# Patient Record
Sex: Female | Born: 1960
Health system: Southern US, Community
[De-identification: ages and names within clinical notes are randomized; demographics above are authoritative.]

## PROBLEM LIST (undated history)

## (undated) DIAGNOSIS — E785 Hyperlipidemia, unspecified: Secondary | ICD-10-CM

## (undated) DIAGNOSIS — L089 Local infection of the skin and subcutaneous tissue, unspecified: Secondary | ICD-10-CM

## (undated) DIAGNOSIS — T4145XA Adverse effect of unspecified anesthetic, initial encounter: Secondary | ICD-10-CM

## (undated) DIAGNOSIS — M1990 Unspecified osteoarthritis, unspecified site: Secondary | ICD-10-CM

## (undated) DIAGNOSIS — F909 Attention-deficit hyperactivity disorder, unspecified type: Secondary | ICD-10-CM

## (undated) DIAGNOSIS — N059 Unspecified nephritic syndrome with unspecified morphologic changes: Secondary | ICD-10-CM

## (undated) DIAGNOSIS — T8859XA Other complications of anesthesia, initial encounter: Secondary | ICD-10-CM

## (undated) DIAGNOSIS — I10 Essential (primary) hypertension: Secondary | ICD-10-CM

## (undated) DIAGNOSIS — G971 Other reaction to spinal and lumbar puncture: Secondary | ICD-10-CM

## (undated) HISTORY — DX: Attention-deficit hyperactivity disorder, unspecified type: F90.9

## (undated) HISTORY — DX: Unspecified nephritic syndrome with unspecified morphologic changes: N05.9

## (undated) HISTORY — PX: ABDOMINAL HYSTERECTOMY: SHX81

## (undated) HISTORY — DX: Hyperlipidemia, unspecified: E78.5

---

## 1997-09-23 ENCOUNTER — Other Ambulatory Visit: Admission: RE | Admit: 1997-09-23 | Discharge: 1997-09-23 | Payer: Self-pay | Admitting: Obstetrics and Gynecology

## 1998-12-04 ENCOUNTER — Other Ambulatory Visit: Admission: RE | Admit: 1998-12-04 | Discharge: 1998-12-04 | Payer: Self-pay | Admitting: Obstetrics and Gynecology

## 1999-11-13 ENCOUNTER — Encounter: Payer: Self-pay | Admitting: Obstetrics and Gynecology

## 1999-11-13 ENCOUNTER — Ambulatory Visit (HOSPITAL_COMMUNITY): Admission: RE | Admit: 1999-11-13 | Discharge: 1999-11-13 | Payer: Self-pay | Admitting: Obstetrics and Gynecology

## 1999-11-28 ENCOUNTER — Encounter: Payer: Self-pay | Admitting: Obstetrics & Gynecology

## 1999-11-28 ENCOUNTER — Inpatient Hospital Stay (HOSPITAL_COMMUNITY): Admission: AD | Admit: 1999-11-28 | Discharge: 1999-11-28 | Payer: Self-pay | Admitting: Obstetrics & Gynecology

## 1999-12-01 ENCOUNTER — Inpatient Hospital Stay (HOSPITAL_COMMUNITY): Admission: AD | Admit: 1999-12-01 | Discharge: 1999-12-05 | Payer: Self-pay | Admitting: Obstetrics and Gynecology

## 1999-12-08 ENCOUNTER — Inpatient Hospital Stay (HOSPITAL_COMMUNITY): Admission: AD | Admit: 1999-12-08 | Discharge: 1999-12-08 | Payer: Self-pay | Admitting: Obstetrics and Gynecology

## 2000-01-29 ENCOUNTER — Other Ambulatory Visit: Admission: RE | Admit: 2000-01-29 | Discharge: 2000-01-29 | Payer: Self-pay | Admitting: Obstetrics and Gynecology

## 2000-10-24 ENCOUNTER — Ambulatory Visit (HOSPITAL_COMMUNITY): Admission: RE | Admit: 2000-10-24 | Discharge: 2000-10-24 | Payer: Self-pay | Admitting: Internal Medicine

## 2000-10-24 ENCOUNTER — Encounter: Payer: Self-pay | Admitting: Internal Medicine

## 2001-10-26 ENCOUNTER — Ambulatory Visit (HOSPITAL_COMMUNITY): Admission: RE | Admit: 2001-10-26 | Discharge: 2001-10-26 | Payer: Self-pay | Admitting: Internal Medicine

## 2001-10-26 ENCOUNTER — Encounter: Payer: Self-pay | Admitting: Internal Medicine

## 2002-09-08 ENCOUNTER — Other Ambulatory Visit: Admission: RE | Admit: 2002-09-08 | Discharge: 2002-09-08 | Payer: Self-pay | Admitting: Obstetrics and Gynecology

## 2003-09-11 ENCOUNTER — Emergency Department (HOSPITAL_COMMUNITY): Admission: EM | Admit: 2003-09-11 | Discharge: 2003-09-11 | Payer: Self-pay | Admitting: Emergency Medicine

## 2004-03-06 ENCOUNTER — Other Ambulatory Visit: Admission: RE | Admit: 2004-03-06 | Discharge: 2004-03-06 | Payer: Self-pay | Admitting: Obstetrics and Gynecology

## 2005-05-01 ENCOUNTER — Other Ambulatory Visit: Admission: RE | Admit: 2005-05-01 | Discharge: 2005-05-01 | Payer: Self-pay | Admitting: Obstetrics and Gynecology

## 2009-04-19 ENCOUNTER — Encounter: Payer: Self-pay | Admitting: Cardiovascular Disease

## 2009-05-30 ENCOUNTER — Ambulatory Visit: Payer: Self-pay | Admitting: Cardiovascular Disease

## 2009-05-30 DIAGNOSIS — R079 Chest pain, unspecified: Secondary | ICD-10-CM | POA: Insufficient documentation

## 2009-05-30 DIAGNOSIS — R002 Palpitations: Secondary | ICD-10-CM

## 2009-06-01 ENCOUNTER — Telehealth (INDEPENDENT_AMBULATORY_CARE_PROVIDER_SITE_OTHER): Payer: Self-pay | Admitting: *Deleted

## 2009-06-05 ENCOUNTER — Ambulatory Visit: Payer: Self-pay | Admitting: Internal Medicine

## 2009-06-05 ENCOUNTER — Ambulatory Visit: Payer: Self-pay

## 2009-06-05 ENCOUNTER — Encounter (HOSPITAL_COMMUNITY): Admission: RE | Admit: 2009-06-05 | Discharge: 2009-08-02 | Payer: Self-pay | Admitting: Cardiovascular Disease

## 2009-06-05 ENCOUNTER — Ambulatory Visit: Payer: Self-pay | Admitting: Cardiovascular Disease

## 2009-06-06 ENCOUNTER — Telehealth (INDEPENDENT_AMBULATORY_CARE_PROVIDER_SITE_OTHER): Payer: Self-pay | Admitting: *Deleted

## 2009-06-08 ENCOUNTER — Encounter: Payer: Self-pay | Admitting: Cardiovascular Disease

## 2009-06-20 ENCOUNTER — Encounter (INDEPENDENT_AMBULATORY_CARE_PROVIDER_SITE_OTHER): Payer: Self-pay | Admitting: *Deleted

## 2009-06-22 ENCOUNTER — Encounter: Admission: RE | Admit: 2009-06-22 | Discharge: 2009-06-22 | Payer: Self-pay | Admitting: Obstetrics and Gynecology

## 2009-06-28 ENCOUNTER — Encounter (INDEPENDENT_AMBULATORY_CARE_PROVIDER_SITE_OTHER): Payer: Self-pay | Admitting: *Deleted

## 2010-03-25 ENCOUNTER — Encounter: Payer: Self-pay | Admitting: Obstetrics and Gynecology

## 2010-04-05 NOTE — Procedures (Signed)
Summary: summary report  summary report   Imported By: Mirna Mires 06/08/2009 10:37:05  _____________________________________________________________________  External Attachment:    Type:   Image     Comment:   External Document

## 2010-04-05 NOTE — Progress Notes (Signed)
Summary: Nuclear Pre-Procedure  Phone Note Outgoing Call Call back at Home Phone [phone]   Call placed by: Stanton Kidney, EMT-P,  June 01, 2009 1:11 PM Action Taken: Phone Call Completed Summary of Call: Left message with information on Myoview Information Sheet (see scanned document for details).     Nuclear Med Background Indications for Stress Test: Evaluation for Ischemia     Symptoms: Palpitations

## 2010-04-05 NOTE — Progress Notes (Signed)
  Walk in Patient Form Recieved ' pt dropped off LAbs" forwarded to [employer]  June 06, 2009 9:43 AM

## 2010-04-05 NOTE — Letter (Signed)
Summary: Pre Visit No Show Letter  [facility]  [address] [place], [place] [address]   Phone: [phone]  Fax: [phone]        June 28, 2009 MRN: [id number]    Penny Rasmussen [address] [place], [place]  [address]    Dear [name],   We have been unable to reach you by phone concerning the pre-procedure visit that you missed on Wednesday 06/28/2009. For this reason,your procedure scheduled on Wednesday 07/12/2009 has been cancelled. Our scheduling staff will gladly assist you with rescheduling your appointments at a more convenient time. Please call our office at [phone] between the hours of 8:00am and 5:00pm, press option #2 to reach an appointment scheduler. Please consider updating your contact numbers at this time so that we can reach you by phone in the future with schedule changes or results.    Thank you,    Ezra Sites RN [facility]

## 2010-04-05 NOTE — Letter (Signed)
Summary: Previsit letter  [facility]  [address] [place], [place] [address]   Phone: [phone]  Fax: [phone]       06/20/2009 MRN: [id number]  [facility] [address] [place], [place]  [address]  Dear Ms. Nicolosi,  Welcome to the Gastroenterology Division at [facility].    You are scheduled to see a nurse for your pre-procedure visit on 06-28-09 at 8:00a.m. on the 3rd floor at [facility], [address]  We ask that you try to arrive at our office 15 minutes prior to your appointment time to allow for check-in.  Your nurse visit will consist of discussing your medical and surgical history, your immediate family medical history, and your medications.    Please bring a complete list of all your medications or, if you prefer, bring the medication bottles and we will list them.  We will need to be aware of both prescribed and over the counter drugs.  We will need to know exact dosage information as well.  If you are on blood thinners (Coumadin, Plavix, Aggrenox, Ticlid, etc.) please call our office today/prior to your appointment, as we need to consult with your physician about holding your medication.   Please be prepared to read and sign documents such as consent forms, a financial agreement, and acknowledgement forms.  If necessary, and with your consent, a friend or relative is welcome to sit-in on the nurse visit with you.  Please bring your insurance card so that we may make a copy of it.  If your insurance requires a referral to see a specialist, please bring your referral form from your primary care physician.  No co-pay is required for this nurse visit.     If you cannot keep your appointment, please call [phone] to cancel or reschedule prior to your appointment date.  This allows [place] the opportunity to schedule an appointment for another patient in need of care.    Thank you for choosing [facility] for your  medical needs.  We appreciate the opportunity to care for you.  Please visit [place] at our website  to learn more about our practice.                     Sincerely.                                                                                                                   The Gastroenterology Division

## 2010-04-05 NOTE — Assessment & Plan Note (Signed)
Summary: Cardiology Nulcear Study  Nuclear Med Background Indications for Stress Test: Evaluation for Ischemia     Symptoms: Chest Pain, Palpitations, Rapid HR    Nuclear Pre-Procedure Caffeine/Decaff Intake: None NPO After: 9:00 PM Lungs: clear IV 0.9% NS with Angio Cath: 20g     IV Site: (R) AC IV Started by: Irean Hong RN Chest Size (in) 36     Cup Size C     Height (in): 62.5 Weight (lb): 130 BMI: 23.48  Nuclear Med Study 1 or 2 day study:  1 day     Stress Test Type:  Stress Reading MD:  Dietrich Pates, MD     Referring MD:  M.Cooper Resting Radionuclide:  Technetium 15m Tetrofosmin     Resting Radionuclide Dose:  11 mCi  Stress Radionuclide:  Technetium 66m Tetrofosmin     Stress Radionuclide Dose:  33 mCi   Stress Protocol Exercise Time (min):  9:30 min     Max HR:  179 bpm     Predicted Max HR:  170 bpm  Max Systolic BP: 157 mm Hg     Percent Max HR:  105.29 %     METS: 10.9 Rate Pressure Product:  [address]    Stress Test Technologist:  Milana Na EMT-P     Nuclear Technologist:  Domenic Polite CNMT  Rest Procedure  Myocardial perfusion imaging was performed at rest 45 minutes following the intravenous administration of Myoview Technetium 77m Tetrofosmin.  Stress Procedure  The patient exercised for 9:30. The patient stopped due to fatigue and denied any chest pain.  There were no significant ST-T wave changes.  Myoview was injected at peak exercise and myocardial perfusion imaging was performed after a brief delay.  QPS Raw Data Images:  Images were motion corrected.  SOft tissue (diaplhragm) underlies heart. Stress Images:  There is normal uptake in all areas. Rest Images:  Normal homogeneous uptake in all areas of the myocardium. Subtraction (SDS):  No evidence of ischemia. Transient Ischemic Dilatation:  .86  (Normal <1.22)  Lung/Heart Ratio:  .26  (Normal <0.45)  Quantitative Gated Spect Images QGS EDV:  52 ml QGS ESV:  20 ml QGS EF:  62  %   Overall Impression  Exercise Capacity: Good exercise capacity. BP Response: Normal blood pressure response. Clinical Symptoms: No chest pain ECG Impression: No significant ST segment change suggestive of ischemia. Overall Impression: Normal stress nuclear study.  Appended Document: Cardiology Nulcear Study Normal stress nuclear study. Results reviewed with pt by telephone. Will f/u as needed.

## 2010-04-05 NOTE — Assessment & Plan Note (Signed)
Summary: 9:00/np6/multiple symptoms/lwb   Visit Type:  Initial Consult Primary Provider:  Dr Vincente Poli  CC:  chest pain.  History of Present Illness: This is a 50 year-old woman who presents for initial evaluation of chest pain and palpitations. She complains of episodic left sided chest pains, both at rest and with exertion, that feel like a 'wave' across the chest. She denies a pressure-like sensation and describes the pain as nonradiating. No associated dyspnea, nausea, or diaphoresis. Also complains of intermittent palpitations, not clearly related to stress or exertion. These oftentimes occur at rest but have also been present with activities. Denies lightheadedness or syncope. She has no history of cardiac disease.  Current Medications (verified): 1)  Enjuvia 0.625 Mg Tabs (Estrogens Conj Synthetic B) .Marland Kitchen.. 1 By Mouth Daily 2)  Adderall 30 Mg Tabs (Amphetamine-Dextroamphetamine) .Marland Kitchen.. 1 By Mouth Daily 3)  Lisinopril 20 Mg Tabs (Lisinopril) .Marland Kitchen.. 1 By Mouth Daily 4)  Lipitor 20 Mg Tabs (Atorvastatin Calcium) .Marland Kitchen.. 1 Po Daily  Allergies (verified): No Known Drug Allergies  Past History:  Past medical, surgical, family and social histories (including risk factors) reviewed, and no changes noted (except as noted below).  Past Medical History: 1. Glomerulonephritis 2. ADHD 3. Hyperlipidemia  Family History: Reviewed history and no changes required. Positive for Coronary and Peripheral Arterial Disease  Social History: Reviewed history and no changes required. Wife of Brieanne Mignone with Vascular Surgery 4 children (3 grown) No tobacco Rare Etoh Active  Review of Systems       Negative except as per HPI   Vital Signs:  Patient profile:   50 year old female Height:      62 inches Weight:      134 pounds BMI:     24.60 Pulse rate:   93 / minute Resp:     16 per minute BP sitting:   128 / 78  (right arm)  Vitals Entered By: Marrion Coy, CNA (May 30, 2009 9:10  AM)  Physical Exam  General:  Pt is alert and oriented, healthy-appearing Caucasion woman, in no acute distress. HEENT: normal Neck: normal carotid upstrokes without bruits, JVP normal Lungs: CTA CV: RRR without murmur or gallop Abd: soft, NT, positive BS, no bruit, no organomegaly Ext: no clubbing, cyanosis, or edema. peripheral pulses 2+ and equal Skin: warm and dry without rash    EKG  Procedure date:  05/30/2009  Findings:      NSR, HR 93 bpm, within normal limits. No significant ST-T changes  Impression & Recommendations:  Problem # 1:  CHEST PAIN (ICD-786.50) The pt has chest pain with both typical and atypical features. CV risk factors include family history of CAD and HTN. Recommend exercise Myoview stress testing for risk stratification. Further recommendations pending stress test results. She is on an appropriate regimen for risk modification, i.e. ACE-I in setting of glomerulonephritis and statin for treatment of hyperlipidemia.  Her updated medication list for this problem includes:    Lisinopril 20 Mg Tabs (Lisinopril) .Marland Kitchen... 1 by mouth daily  Orders: EKG w/ Interpretation ([address]) Holter Monitor (Holter Monitor) Nuclear Stress Test (Nuc Stress Test)  Problem # 2:  PALPITATIONS (ICD-785.1) Check 24 hour Holter monitor to rule out significant arrhythmia.  Her updated medication list for this problem includes:    Lisinopril 20 Mg Tabs (Lisinopril) .Marland Kitchen... 1 by mouth daily  Orders: EKG w/ Interpretation ([address]) Holter Monitor (Holter Monitor) Nuclear Stress Test (Nuc Stress Test)  Patient Instructions: 1)  Your physician recommends that you schedule  a follow-up appointment as needed 2)  Your physician recommends that you continue on your current medications as directed. Please refer to the Current Medication list given to you today. 3)  Your physician has recommended that you wear a holter monitor.  Holter monitors are medical devices that record the heart's  electrical activity. Doctors most often use these monitors to diagnose arrhythmias. Arrhythmias are problems with the speed or rhythm of the heartbeat. The monitor is a small, portable device. You can wear one while you do your normal daily activities. This is usually used to diagnose what is causing palpitations/syncope (passing out). 4)  Your physician has requested that you have an exercise stress myoview.  For further information please visit [email or web address]  Please follow instruction sheet, as given. 5)  You have been diagnosed with palpitations. Palpitations are described as a gradual or sudden awareness of the beating of your heart. It may last seconds, minutes, hours, or days and may be caused by the heart beating slower, faster, more strongly, or more irregularly than normal. They are very common and usually not dangerous. Please see the handout/brochure given to you today for more information.

## 2010-07-20 NOTE — Procedures (Signed)
[facility]  Patient:    Penny Rasmussen, Penny Rasmussen                    MRN: [id number] Proc. Date: 11/29/99 Attending:  Madelyn Rasmussen CC:         Penny Rasmussen, M.D.   Procedure Report  DATE OF BIRTH:  Jul 18, 1947  PROCEDURE:  Lumbar epidural steroid injection.  DIAGNOSIS:  Multilevel degenerative disk disease of the lumbar spine.  HISTORY:  Penny Rasmussen is a very pleasant 50 year old who is sent to [place] by Penny Rasmussen for a series of lumbar epidural steroid injections.  The patient states that she has a long history of back discomfort which dates back to the mid to early 1980s.  She initially required surgery on her lower back in [place] and did well postoperatively up until the early 1990s, when she had recurrence of her back discomfort, and she was treated with a series of epidural steroid injections by Penny Rasmussen with in improvement in 1995, and eventually had repeat surgery by Penny Rasmussen, and she was doing well following this. Unfortunately, on November 30, 1997, the patient fell at work and re-injured her back.  She was seen at [facility] and sent to [facility] where she was treated by Penny Rasmussen.  She recalls getting discograms but was sedated and does not recall whether they initiated anything for her discomfort.  She does not recall any medications that were used at that time.  [employer] insisted, according to the patient, that she go to see Dr. ______ , a physician with the spine institute in [place].  He apparently proceeded with acupuncture and a variety of OxyContin plus several others, the names of which escape her.  She felt as though she was more of a zombie on the medications and did not sustain any improvement.  She did develop headaches to the OxyContin.  The patient returned to see Penny Rasmussen in July, mainly because he had operated on her knees previously and she had had very good  results.  He re-evaluated her and started her on Celebrex, which she states is not very helpful, and Neurontin, but this apparently caused mental alterations, and she has not continued on this.  She currently describes her pain as a constant discomfort in her lower back which is increased markedly by standing to cook within 5-10 minutes with radiation out into the hips and down to the lower extremities with a sense of giveaway weakness and numbness and tingling.  Over the past six weeks, she has noted that when her pain is at its worst, she develops redness over her shins down to the sole of her foot.  She has numbness and tingling to the bottoms of her feet.  Her pain is made worse by walking, standing in a stationary position, or sitting for long periods of time, or lifting.  It is improved by lying down and hydrocodone.  Over the course of the past three years, the pain intensity has increased, and her limitations have also increased.  She has developed poor quality of sleep and episodes of crying.  She continues to have some knee discomfort from her previous surgery.  She is currently seeking legal advice with regard to her Workmens Comp. case.  CURRENT MEDICATIONS:  Celebrex and hydrocodone, and she states that hydrocodone does take the edge off.  ALLERGIES:  PENICILLIN causes hives, CODEINE causes nausea and vomiting.  FAMILY HISTORY:  The patient is adopted.  SOCIAL HISTORY:  The patient is a pack-per-day smoker.  She does not drink alcohol.  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  PAST SURGICAL HISTORY:  Significant for two back surgeries, 23 knee surgeries with knee replacements, gallbladder surgery, right carpal tunnel release, and hysterectomy.  ACTIVE MEDICAL PROBLEMS:  The patient denies any active medical problems.  REVIEW OF SYSTEMS:  GENERAL:  Negative.  HEAD:  Significant for headaches which emanate from the neck forward and have been present for  about the past six months.  They are helped by Tylenol.  EYES: Negative.  NOSE/MOUTH/THROAT: Negative.  EARS:  Negative.  LUNGS:  Negative.  CARDIOVASCULAR:  Negative. GI:  Significant for some gastroesophageal reflux which she attributes to some medications.  She uses some over-the-counter preparations from [employer].  GU: Negative.  MUSCULOSKELETAL:  Significant for ongoing knee discomfort.  She has a history of phlebitis in the left lower extremity.  NEUROLOGICAL:  See HPI. No history of seizure or stroke.  CUTANEOUS:  Negative.  ENDOCRINE:  Negative. PSYCHIATRIC:  She does feel somewhat depressed.  ALLERGY/IMMUNOLOGIC: Negative except for drug allergies.  PHYSICAL EXAMINATION:  VITAL SIGNS:  Blood pressure 142/96, heart rate 75, respiratory rate 18, O2 saturations 93%.  Temperature is 97 degrees.  Pain level is 9/10.  GENERAL:  This is a very pleasant female in no acute distress.  She uses a cane to ambulate.  HEENT:  Head is normocephalic, atraumatic.  Eyes with extraocular movements intact with conjunctive sclerae clear.  Nose with patent nares.  Oropharynx demonstrates dental plates with mucosa intact.  NECK:  Demonstrated mild restriction of range of motion with carotids 2+ and symmetric without appreciable bruits.  HEART:  Regular rate and rhythm.  LUNGS:  Lungs are clear.  BREASTS/ABDOMINAL/PELVIC/RECTAL:  Examinations are not performed.  BACK:  Back exam reveals well healed surgical scar with negative straight leg raise signs with tenderness over the lower lumbar spinous processes.  EXTREMITIES:  The patient is status post surgical interventions over her knees with some limitations of range of motion.  She has trace pitting edema of the lower extremities bilaterally.  Dorsalis pedis pulses and radial pulses are 2+ and symmetric.  NEUROLOGIC:  The patient is oriented x 4.  Cranial nerves 2-12 are grossly intact.  Deep tendon reflexes are symmetric in the upper and  lower extremities  with downgoing toes.  Motor is 5/5 with symmetric bulk and tone.  Sensory is intact to scratch and vibratory sense.  Coordination is grossly intact.  An MRI interpretation from [facility] revealed multilevel severe degenerative disk disease at 2-3, 3-4, 4-5, and to a lesser extent at L5-S1.  IMPRESSION: 1. Low back pain which apparently was responsive to previous surgeries with    exacerbation on November 30, 1997, with MRI demonstrating multilevel    degenerative disk disease. 2. Tension headaches. 3. Gastroesophageal reflux disease.  DISPOSITION:  I discussed the potential risks, benefits, and limitations of a lumbar epidural steroid injection in detail with the patient as well as the side effects of corticosteroids.  She wishes to proceed.  DESCRIPTION OF PROCEDURE:  After informed consent was obtained, the patient was placed in a sitting position and monitored.  Her back was prepped with Betadine x 3.  A skin wheal was raised at the L4-L5 interspace with 1% lidocaine.  A 20 gauge Tuohy needle was introduced to the lumbar epidural space to loss of resistance to preservative-free normal saline.  There was no CSF nor blood.  Medrol 80 mg in 10 mL of preservative-free normal saline was gently injected.  The needle was flushed with preservative-free normal saline and removed intact.  Postprocedure condition - stable.  DISCHARGE INSTRUCTIONS: 1. Resume previous diet. 2. Limitations on activities per instruction sheet. 3. Continue on current medications. 4. Follow up with me in one to two weeks for a repeat injection. DD:  11/29/99 TD:  11/29/99 Job: 1600 [id number]

## 2010-07-20 NOTE — Op Note (Signed)
[facility]  Patient:    Penny Rasmussen, Penny Rasmussen                    MRN: [id number] Proc. Date: 12/01/99 Adm. Date:  [id number] Attending:  Rhina Brackett                           Operative Report  PREOPERATIVE DIAGNOSIS:       36-week intrauterine pregnancy.  Previous cesarean section x 3.  Pregnancy-induced hypertension.  POSTOPERATIVE DIAGNOSIS:      36-week intrauterine pregnancy.  Previous cesarean section x 3.  Pregnancy-induced hypertension.  OPERATION:                    Repeat low transverse cesarean section.  SURGEON:                      Duke Salvia. Marcelle Overlie, M.D.  ASSISTANT:                    Juluis Mire, M.D.  ANESTHESIA:                   Spinal.  COMPLICATIONS:                None.  DRAINS:                       Foley catheter.  ESTIMATED BLOOD LOSS:         900 cc.  DESCRIPTION OF PROCEDURE:     The patient was taken to the operating room and after an adequate level of spinal anesthetic was obtained with the patient in left tilt position, the abdomen was prepped and draped in the usual fashion for sterile abdominal procedures.  Foley catheter was positioned draining clear urine.  A transverse incision was made excising the old scar.  This was carried down to the fascia which was excised and extended transversely.  The rectus muscles were divided in the midline.  The peritoneum was entered superiorly without incident and extended in a vertical manner.  The bladder blade was positioned.  The vesicouterine serosa was then incised and the bladder was bluntly and sharply dissected off of the lower uterine segment and the bladder blade was repositioned.  A transverse incision was made in the lower segment, extended transversely with blunt dissection.  Clear fluid was noted.  The patient was delivered of a 6 pound 11 ounce female, Apgars 8 and 9. The infant was suctioned, cord clamped, and passed to the pediatric team for further  care.  The placenta was delivered manually intact.  The uterus was exteriorized.  The cavity was wiped clean with laparotomy pack.  Closure obtained in the first layer of 0 chromic in a locking fashion followed by an imbricating layer of 0 chromic.  This was hemostatic.  Both tubes and ovaries were normal.  The left ovary was somewhat attenuated with only 1.5 x 1.5 cm portion of ovary remaining with some omental adhesions that were filmy to that side which were lysed.  No other abnormalities noted.  Prior to closure, sponge, needle, and instrument counts were correct x 2.  The rectus muscles were then reapproximated with 2-0 Dexon interrupted sutures.  The fascia closed from laterally to midline on either side with a 0 Dexon running suture. The subcutaneous fat was hemostatic.  Clips and Steri-Strips were used on  the skin.  Pressure dressing was applied.  She tolerated this well and went to the recovery room in good condition.  Clear urine was noted at the end of the case.  She did receive Pitocin IV and Ancef 1 gram IV after the cord was clamped. DD:  12/01/99 TD:  12/01/99 Job: [id number]

## 2010-07-20 NOTE — Discharge Summary (Signed)
[facility]  Patient:    Penny Rasmussen, Penny Rasmussen                    MRN: [id number] Adm. Date:  [id number] Disc. Date: [id number] Attending:  Madelyn Flavors Dictator:   Danie Chandler, R.N.                           Discharge Summary  ADMISSION DIAGNOSES:          1. 36 week intrauterine pregnancy with previous                                  cesarean section x 3.                               2. Pregnancy-induced hypertension.  DISCHARGE DIAGNOSES:          1. 36 week intrauterine pregnancy with previous                                  cesarean section x 3.                               2. Pregnancy-induced hypertension.  PROCEDURES:                   On December 01, 1999, repeat low transverse cesarean section.  REASON FOR ADMISSION:         Please see the dictated H&P.  HOSPITAL COURSE:              The patient was taken to the operating room and underwent the above-named procedure without complications.  This was productive of a viable female infant with Apgars of 8 at one minute and 9 at five minutes.  Postoperatively, the patient did well.  On postoperative day #1, the patient was on magnesium sulfate.  She denied any PIH signs or symptoms.  Her blood pressure was stable.  Her urine output was greater than 200 cc/hr.  Her hemoglobin was 10.6 and platelets 179,000.  Liver function tests okay.  The patient was weaned off magnesium sulfate on this day.  On postoperative day #2, the patient was without complaint, except for pedal edema.  Her blood pressure was 140/96.  She had negative right upper quadrant tenderness.  Her deep tendon reflexes were 1+ with no clonus and she had 1+ edema.  Her PIH labs were rechecked this day and were stable.  She had a repeat ordered for the following day.  On postoperative day #3, the patient was without complaints.  She denied headache, visual change, or right upper quadrant pain.  Her blood pressures were  142-158/80-102.  Her deep tendon reflexes were 1-2+ with no clonus.  She did have increased liver function tests this day, as well as an increased blood pressure.  The patient was watched this day and had repeat laboratory work ordered for the following day. On postoperative day #4, the patient desired discharge home.  Her blood pressures were in the 140s/80s.  There was an increase in the patients SGOT to 72.  Her deep tendon reflexes were 2+.  Those were rechecked in the afternoon and they were  stable.  The SGOT is now 61.  The patient was discharged home on this day.  CONDITION ON DISCHARGE:       Good.  DIET:                         Regular as tolerated.  ACTIVITY:                     No heavy lifting.  No driving.  No vaginal entry.  FOLLOW-UP:                    She is to follow up in the office in two days for blood pressure check, as well as PIH labs.  She is to call for temperature greater than 100 degrees, persistent nausea and vomiting, heavy vaginal bleeding, and/or redness or drainage from the incision, as well as any PIH signs or symptoms.  DISCHARGE MEDICATIONS:        Prenatal vitamins one p.o. q.d. and Percocet one to two p.o. q.4h. p.r.n. pain. DD:  01/03/00 TD:  01/03/00 Job: [id number]

## 2010-09-03 ENCOUNTER — Encounter: Payer: Self-pay | Admitting: Cardiovascular Disease

## 2011-09-25 ENCOUNTER — Ambulatory Visit: Payer: Self-pay | Admitting: Cardiovascular Disease

## 2012-12-03 ENCOUNTER — Other Ambulatory Visit: Payer: Self-pay | Admitting: Family Medicine

## 2012-12-03 DIAGNOSIS — Z1231 Encounter for screening mammogram for malignant neoplasm of breast: Secondary | ICD-10-CM

## 2012-12-10 ENCOUNTER — Ambulatory Visit
Admission: RE | Admit: 2012-12-10 | Discharge: 2012-12-10 | Disposition: A | Discharge status: Home / Self Care | Payer: 59 | Source: Ambulatory Visit | Attending: Family Medicine | Admitting: Family Medicine

## 2012-12-10 DIAGNOSIS — Z1231 Encounter for screening mammogram for malignant neoplasm of breast: Secondary | ICD-10-CM

## 2013-04-20 ENCOUNTER — Other Ambulatory Visit: Payer: Self-pay | Admitting: Vascular Surgery

## 2014-04-22 ENCOUNTER — Other Ambulatory Visit: Payer: Self-pay | Admitting: Family Medicine

## 2014-04-22 DIAGNOSIS — Z1231 Encounter for screening mammogram for malignant neoplasm of breast: Secondary | ICD-10-CM

## 2014-04-29 ENCOUNTER — Ambulatory Visit: Payer: 59

## 2014-05-04 ENCOUNTER — Ambulatory Visit: Payer: 59

## 2015-01-13 ENCOUNTER — Other Ambulatory Visit: Payer: Self-pay | Admitting: Family Medicine

## 2015-01-13 DIAGNOSIS — R945 Abnormal results of liver function studies: Secondary | ICD-10-CM

## 2015-01-20 ENCOUNTER — Ambulatory Visit
Admission: RE | Admit: 2015-01-20 | Discharge: 2015-01-20 | Disposition: A | Discharge status: Home / Self Care | Payer: 59 | Source: Ambulatory Visit | Attending: Family Medicine | Admitting: Family Medicine

## 2015-01-20 DIAGNOSIS — R945 Abnormal results of liver function studies: Secondary | ICD-10-CM

## 2015-03-07 DIAGNOSIS — J329 Chronic sinusitis, unspecified: Secondary | ICD-10-CM | POA: Diagnosis not present

## 2015-03-07 MED FILL — AZELASTINE 0.15% NASAL SPRY: 0.15 | 30 days supply | Qty: 30 | Fill #0

## 2015-03-07 MED FILL — CEFDINIR 300 MG CAPSULE: 300 | 5 days supply | Qty: 10 | Fill #0

## 2015-04-17 DIAGNOSIS — N19 Unspecified kidney failure: Secondary | ICD-10-CM | POA: Diagnosis not present

## 2015-04-17 DIAGNOSIS — R945 Abnormal results of liver function studies: Secondary | ICD-10-CM | POA: Diagnosis not present

## 2015-04-17 DIAGNOSIS — M1000 Idiopathic gout, unspecified site: Secondary | ICD-10-CM | POA: Diagnosis not present

## 2015-04-17 DIAGNOSIS — E782 Mixed hyperlipidemia: Secondary | ICD-10-CM | POA: Diagnosis not present

## 2015-04-19 DIAGNOSIS — E782 Mixed hyperlipidemia: Secondary | ICD-10-CM | POA: Diagnosis not present

## 2015-04-19 DIAGNOSIS — M1000 Idiopathic gout, unspecified site: Secondary | ICD-10-CM | POA: Diagnosis not present

## 2015-04-19 DIAGNOSIS — I10 Essential (primary) hypertension: Secondary | ICD-10-CM | POA: Diagnosis not present

## 2015-04-19 DIAGNOSIS — R945 Abnormal results of liver function studies: Secondary | ICD-10-CM | POA: Diagnosis not present

## 2015-04-19 MED FILL — ESCITALOPRAM 20 MG TABLET: 20 | 90 days supply | Qty: 45 | Fill #0

## 2015-04-19 MED FILL — AMLODIPINE BESYLATE 5 MG TA: 5 | 90 days supply | Qty: 90 | Fill #0

## 2015-04-19 MED FILL — ATORVASTATIN 20 MG TABLET: 20 | 90 days supply | Qty: 90 | Fill #0

## 2015-06-06 DIAGNOSIS — E782 Mixed hyperlipidemia: Secondary | ICD-10-CM | POA: Diagnosis not present

## 2015-06-06 DIAGNOSIS — M1000 Idiopathic gout, unspecified site: Secondary | ICD-10-CM | POA: Diagnosis not present

## 2015-06-06 DIAGNOSIS — R945 Abnormal results of liver function studies: Secondary | ICD-10-CM | POA: Diagnosis not present

## 2015-06-22 MED FILL — ESCITALOPRAM 20 MG TABLET: 20 | 30 days supply | Qty: 30 | Fill #0

## 2015-07-10 DIAGNOSIS — R319 Hematuria, unspecified: Secondary | ICD-10-CM | POA: Diagnosis not present

## 2015-07-10 DIAGNOSIS — R358 Other polyuria: Secondary | ICD-10-CM | POA: Diagnosis not present

## 2015-07-10 DIAGNOSIS — N390 Urinary tract infection, site not specified: Secondary | ICD-10-CM | POA: Diagnosis not present

## 2015-07-10 DIAGNOSIS — N952 Postmenopausal atrophic vaginitis: Secondary | ICD-10-CM | POA: Diagnosis not present

## 2015-07-10 MED FILL — SULFAMETHOXAZOLE/TMP DS TAB: 800-160 | 3 days supply | Qty: 6 | Fill #0

## 2015-07-10 MED FILL — ESTRACE 0.01% CREAM: 0.1 | 30 days supply | Qty: 43 | Fill #0

## 2015-07-26 MED FILL — ESCITALOPRAM 20 MG TABLET: 20 | 30 days supply | Qty: 30 | Fill #1

## 2015-07-26 MED FILL — ATORVASTATIN 20 MG TABLET: 20 | 90 days supply | Qty: 90 | Fill #1

## 2015-07-27 MED FILL — AMLODIPINE BESYLATE 5 MG TA: 5 | 90 days supply | Qty: 90 | Fill #0

## 2015-08-25 DIAGNOSIS — J349 Unspecified disorder of nose and nasal sinuses: Secondary | ICD-10-CM | POA: Diagnosis not present

## 2015-08-25 MED FILL — AMOX TR-K CLV 875-125 MG TA: 875-125 | 10 days supply | Qty: 20 | Fill #0

## 2015-08-28 MED FILL — ESCITALOPRAM 20 MG TABLET: 20 | 30 days supply | Qty: 30 | Fill #2

## 2015-10-10 MED FILL — ESCITALOPRAM 20 MG TABLET: 20 | 30 days supply | Qty: 30 | Fill #3

## 2015-10-31 MED FILL — ATORVASTATIN 20 MG TABLET: 20 | 90 days supply | Qty: 90 | Fill #2

## 2015-11-01 MED FILL — AMLODIPINE BESYLATE 5 MG TA: 5 | 90 days supply | Qty: 90 | Fill #0

## 2015-11-27 MED FILL — ESCITALOPRAM 20 MG TABLET: 20 | 90 days supply | Qty: 90 | Fill #0

## 2015-11-29 MED FILL — COLCHICINE 0.6 MG TABLET: 0.6 | 10 days supply | Qty: 30 | Fill #0

## 2015-12-05 DIAGNOSIS — Z23 Encounter for immunization: Secondary | ICD-10-CM | POA: Diagnosis not present

## 2016-02-06 DIAGNOSIS — Z0000 Encounter for general adult medical examination without abnormal findings: Secondary | ICD-10-CM | POA: Diagnosis not present

## 2016-02-09 ENCOUNTER — Other Ambulatory Visit: Payer: Self-pay | Admitting: Family Medicine

## 2016-02-09 DIAGNOSIS — Z6830 Body mass index (BMI) 30.0-30.9, adult: Secondary | ICD-10-CM | POA: Diagnosis not present

## 2016-02-09 DIAGNOSIS — Z0000 Encounter for general adult medical examination without abnormal findings: Secondary | ICD-10-CM | POA: Diagnosis not present

## 2016-02-09 DIAGNOSIS — Z1231 Encounter for screening mammogram for malignant neoplasm of breast: Secondary | ICD-10-CM

## 2016-02-09 DIAGNOSIS — Z23 Encounter for immunization: Secondary | ICD-10-CM | POA: Diagnosis not present

## 2016-02-09 DIAGNOSIS — Z1211 Encounter for screening for malignant neoplasm of colon: Secondary | ICD-10-CM | POA: Diagnosis not present

## 2016-02-09 MED FILL — ATORVASTATIN 40 MG TABLET: 40 | 90 days supply | Qty: 90 | Fill #0

## 2016-02-09 MED FILL — COLCHICINE 0.6 MG TABLET: 0.6 | 10 days supply | Qty: 30 | Fill #0

## 2016-02-14 MED FILL — AMLODIPINE BESYLATE 5 MG TA: 5 | 90 days supply | Qty: 90 | Fill #0

## 2016-02-21 DIAGNOSIS — H524 Presbyopia: Secondary | ICD-10-CM | POA: Diagnosis not present

## 2016-02-21 DIAGNOSIS — H5203 Hypermetropia, bilateral: Secondary | ICD-10-CM | POA: Diagnosis not present

## 2016-02-21 DIAGNOSIS — H52223 Regular astigmatism, bilateral: Secondary | ICD-10-CM | POA: Diagnosis not present

## 2016-03-19 ENCOUNTER — Ambulatory Visit
Admission: RE | Admit: 2016-03-19 | Discharge: 2016-03-19 | Disposition: A | Discharge status: Home / Self Care | Payer: 59 | Source: Ambulatory Visit | Attending: Family Medicine | Admitting: Family Medicine

## 2016-03-19 ENCOUNTER — Ambulatory Visit: Payer: 59

## 2016-03-19 DIAGNOSIS — Z1231 Encounter for screening mammogram for malignant neoplasm of breast: Secondary | ICD-10-CM | POA: Diagnosis not present

## 2016-04-01 MED FILL — ESCITALOPRAM 20 MG TABLET: 20 | 90 days supply | Qty: 90 | Fill #0

## 2016-04-08 DIAGNOSIS — L814 Other melanin hyperpigmentation: Secondary | ICD-10-CM | POA: Diagnosis not present

## 2016-04-08 DIAGNOSIS — D225 Melanocytic nevi of trunk: Secondary | ICD-10-CM | POA: Diagnosis not present

## 2016-04-08 DIAGNOSIS — D2261 Melanocytic nevi of right upper limb, including shoulder: Secondary | ICD-10-CM | POA: Diagnosis not present

## 2016-04-08 DIAGNOSIS — L821 Other seborrheic keratosis: Secondary | ICD-10-CM | POA: Diagnosis not present

## 2016-04-08 DIAGNOSIS — L718 Other rosacea: Secondary | ICD-10-CM | POA: Diagnosis not present

## 2016-04-08 DIAGNOSIS — D171 Benign lipomatous neoplasm of skin and subcutaneous tissue of trunk: Secondary | ICD-10-CM | POA: Diagnosis not present

## 2016-04-08 DIAGNOSIS — D2271 Melanocytic nevi of right lower limb, including hip: Secondary | ICD-10-CM | POA: Diagnosis not present

## 2016-04-08 DIAGNOSIS — L570 Actinic keratosis: Secondary | ICD-10-CM | POA: Diagnosis not present

## 2016-04-08 DIAGNOSIS — D2371 Other benign neoplasm of skin of right lower limb, including hip: Secondary | ICD-10-CM | POA: Diagnosis not present

## 2016-04-08 DIAGNOSIS — D2262 Melanocytic nevi of left upper limb, including shoulder: Secondary | ICD-10-CM | POA: Diagnosis not present

## 2016-05-17 MED FILL — ATORVASTATIN 40 MG TABLET: 40 | 90 days supply | Qty: 90 | Fill #1

## 2016-05-17 MED FILL — AMLODIPINE BESYLATE 5 MG TA: 5 | 90 days supply | Qty: 90 | Fill #0

## 2016-06-25 DIAGNOSIS — M1000 Idiopathic gout, unspecified site: Secondary | ICD-10-CM | POA: Diagnosis not present

## 2016-06-25 DIAGNOSIS — E782 Mixed hyperlipidemia: Secondary | ICD-10-CM | POA: Diagnosis not present

## 2016-07-02 DIAGNOSIS — F330 Major depressive disorder, recurrent, mild: Secondary | ICD-10-CM | POA: Diagnosis not present

## 2016-07-02 DIAGNOSIS — M1000 Idiopathic gout, unspecified site: Secondary | ICD-10-CM | POA: Diagnosis not present

## 2016-07-02 DIAGNOSIS — E782 Mixed hyperlipidemia: Secondary | ICD-10-CM | POA: Diagnosis not present

## 2016-07-02 DIAGNOSIS — N19 Unspecified kidney failure: Secondary | ICD-10-CM | POA: Diagnosis not present

## 2016-07-02 MED FILL — ZOLPIDEM TARTRATE 5 MG TAB: 5 | 30 days supply | Qty: 30 | Fill #0

## 2016-07-02 MED FILL — ESCITALOPRAM 20 MG TABLET: 20 | 90 days supply | Qty: 90 | Fill #0

## 2016-08-08 MED FILL — ZOLPIDEM TARTRATE 5 MG TAB: 5 | 30 days supply | Qty: 30 | Fill #1

## 2016-08-14 ENCOUNTER — Ambulatory Visit: Payer: Self-pay | Admitting: Surgery

## 2016-08-14 DIAGNOSIS — M799 Soft tissue disorder, unspecified: Secondary | ICD-10-CM | POA: Diagnosis not present

## 2016-08-14 MED FILL — ATORVASTATIN 40 MG TABLET: 40 | 90 days supply | Qty: 90 | Fill #0

## 2016-08-14 MED FILL — AMLODIPINE BESYLATE 5 MG TA: 5 | 90 days supply | Qty: 90 | Fill #1

## 2016-10-21 ENCOUNTER — Encounter (HOSPITAL_BASED_OUTPATIENT_CLINIC_OR_DEPARTMENT_OTHER)
Admission: RE | Admit: 2016-10-21 | Discharge: 2016-10-21 | Disposition: A | Discharge status: Home / Self Care | Payer: 59 | Source: Ambulatory Visit | Attending: Surgery | Admitting: Surgery

## 2016-10-21 ENCOUNTER — Encounter (HOSPITAL_BASED_OUTPATIENT_CLINIC_OR_DEPARTMENT_OTHER): Payer: Self-pay | Admitting: *Deleted

## 2016-10-21 DIAGNOSIS — Z01810 Encounter for preprocedural cardiovascular examination: Secondary | ICD-10-CM | POA: Diagnosis not present

## 2016-10-21 DIAGNOSIS — M799 Soft tissue disorder, unspecified: Secondary | ICD-10-CM | POA: Diagnosis not present

## 2016-10-24 ENCOUNTER — Ambulatory Visit (HOSPITAL_BASED_OUTPATIENT_CLINIC_OR_DEPARTMENT_OTHER): Payer: 59 | Admitting: Anesthesiology

## 2016-10-24 ENCOUNTER — Encounter (HOSPITAL_BASED_OUTPATIENT_CLINIC_OR_DEPARTMENT_OTHER)
Admission: RE | Disposition: A | Discharge status: Home / Self Care | Payer: Self-pay | Source: Ambulatory Visit | Attending: Surgery

## 2016-10-24 ENCOUNTER — Encounter (HOSPITAL_BASED_OUTPATIENT_CLINIC_OR_DEPARTMENT_OTHER): Payer: Self-pay

## 2016-10-24 ENCOUNTER — Ambulatory Visit (HOSPITAL_BASED_OUTPATIENT_CLINIC_OR_DEPARTMENT_OTHER)
Admission: RE | Admit: 2016-10-24 | Discharge: 2016-10-24 | Disposition: A | Discharge status: Home / Self Care | Payer: 59 | Source: Ambulatory Visit | Attending: Surgery | Admitting: Surgery

## 2016-10-24 DIAGNOSIS — I10 Essential (primary) hypertension: Secondary | ICD-10-CM | POA: Diagnosis not present

## 2016-10-24 DIAGNOSIS — Z8249 Family history of ischemic heart disease and other diseases of the circulatory system: Secondary | ICD-10-CM | POA: Insufficient documentation

## 2016-10-24 DIAGNOSIS — N059 Unspecified nephritic syndrome with unspecified morphologic changes: Secondary | ICD-10-CM | POA: Diagnosis not present

## 2016-10-24 DIAGNOSIS — M1990 Unspecified osteoarthritis, unspecified site: Secondary | ICD-10-CM | POA: Diagnosis not present

## 2016-10-24 DIAGNOSIS — F909 Attention-deficit hyperactivity disorder, unspecified type: Secondary | ICD-10-CM | POA: Diagnosis not present

## 2016-10-24 DIAGNOSIS — E7800 Pure hypercholesterolemia, unspecified: Secondary | ICD-10-CM | POA: Insufficient documentation

## 2016-10-24 DIAGNOSIS — D171 Benign lipomatous neoplasm of skin and subcutaneous tissue of trunk: Secondary | ICD-10-CM | POA: Insufficient documentation

## 2016-10-24 DIAGNOSIS — M7989 Other specified soft tissue disorders: Secondary | ICD-10-CM | POA: Diagnosis present

## 2016-10-24 DIAGNOSIS — L918 Other hypertrophic disorders of the skin: Secondary | ICD-10-CM | POA: Diagnosis not present

## 2016-10-24 DIAGNOSIS — R222 Localized swelling, mass and lump, trunk: Secondary | ICD-10-CM | POA: Diagnosis not present

## 2016-10-24 DIAGNOSIS — Z79899 Other long term (current) drug therapy: Secondary | ICD-10-CM | POA: Insufficient documentation

## 2016-10-24 HISTORY — DX: Other complications of anesthesia, initial encounter: T88.59XA

## 2016-10-24 HISTORY — DX: Unspecified osteoarthritis, unspecified site: M19.90

## 2016-10-24 HISTORY — DX: Other reaction to spinal and lumbar puncture: G97.1

## 2016-10-24 HISTORY — DX: Adverse effect of unspecified anesthetic, initial encounter: T41.45XA

## 2016-10-24 HISTORY — PX: LIPOMA EXCISION: SHX5283

## 2016-10-24 HISTORY — DX: Essential (primary) hypertension: I10

## 2016-10-24 SURGERY — EXCISION LIPOMA
Anesthesia: General | Site: Back | Laterality: Right

## 2016-10-24 MED ORDER — MIDAZOLAM HCL 5 MG/5ML IJ SOLN
INTRAMUSCULAR | Status: DC | PRN
  Administered 2016-10-24: 2 mg via INTRAVENOUS

## 2016-10-24 MED ORDER — ONDANSETRON HCL 4 MG/2ML IJ SOLN
INTRAMUSCULAR | Status: DC | PRN
  Administered 2016-10-24: 4 mg via INTRAVENOUS

## 2016-10-24 MED ORDER — LIDOCAINE HCL (PF) 1 % IJ SOLN
INTRAMUSCULAR | Status: AC
  Filled 2016-10-24: qty 30

## 2016-10-24 MED ORDER — EPHEDRINE SULFATE 50 MG/ML IJ SOLN
INTRAMUSCULAR | Status: DC | PRN
  Administered 2016-10-24: 10 mg via INTRAVENOUS

## 2016-10-24 MED ORDER — SCOPOLAMINE 1 MG/3DAYS TD PT72: 1.0000 | MEDICATED_PATCH | Freq: Once | TRANSDERMAL | Status: DC | PRN

## 2016-10-24 MED ORDER — PHENYLEPHRINE HCL 10 MG/ML IJ SOLN
INTRAMUSCULAR | Status: DC | PRN
  Administered 2016-10-24 (×2): 80 ug via INTRAVENOUS

## 2016-10-24 MED ORDER — BUPIVACAINE HCL (PF) 0.5 % IJ SOLN
INTRAMUSCULAR | Status: AC
  Filled 2016-10-24: qty 30

## 2016-10-24 MED ORDER — PROPOFOL 10 MG/ML IV BOLUS
INTRAVENOUS | Status: AC
  Filled 2016-10-24: qty 20

## 2016-10-24 MED ORDER — PROMETHAZINE HCL 25 MG/ML IJ SOLN: 6.2500 mg | INTRAMUSCULAR | Status: DC | PRN

## 2016-10-24 MED ORDER — LIDOCAINE 2% (20 MG/ML) 5 ML SYRINGE
INTRAMUSCULAR | Status: AC
  Filled 2016-10-24: qty 5

## 2016-10-24 MED ORDER — LIDOCAINE HCL (CARDIAC) 20 MG/ML IV SOLN: INTRAVENOUS | Status: DC | PRN

## 2016-10-24 MED ORDER — CEFAZOLIN SODIUM-DEXTROSE 2-4 GM/100ML-% IV SOLN
INTRAVENOUS | Status: AC
  Filled 2016-10-24: qty 100

## 2016-10-24 MED ORDER — HYDROCODONE-ACETAMINOPHEN 5-325 MG PO TABS: 1.0000 | ORAL_TABLET | ORAL | 0 refills | Status: DC | PRN

## 2016-10-24 MED ORDER — MIDAZOLAM HCL 2 MG/2ML IJ SOLN: 1.0000 mg | INTRAMUSCULAR | Status: DC | PRN

## 2016-10-24 MED ORDER — LACTATED RINGERS IV SOLN
INTRAVENOUS | Status: DC
  Administered 2016-10-24: 08:00:00 via INTRAVENOUS

## 2016-10-24 MED ORDER — BUPIVACAINE-EPINEPHRINE (PF) 0.5% -1:200000 IJ SOLN
INTRAMUSCULAR | Status: AC
  Filled 2016-10-24: qty 30

## 2016-10-24 MED ORDER — CEFAZOLIN SODIUM-DEXTROSE 2-4 GM/100ML-% IV SOLN
2.0000 g | INTRAVENOUS | Status: AC
  Administered 2016-10-24: 2 g via INTRAVENOUS

## 2016-10-24 MED ORDER — PHENYLEPHRINE 40 MCG/ML (10ML) SYRINGE FOR IV PUSH (FOR BLOOD PRESSURE SUPPORT)
PREFILLED_SYRINGE | INTRAVENOUS | Status: AC
  Filled 2016-10-24: qty 10

## 2016-10-24 MED ORDER — LACTATED RINGERS IV SOLN
INTRAVENOUS | Status: DC | PRN
  Administered 2016-10-24: 07:00:00 via INTRAVENOUS

## 2016-10-24 MED ORDER — FENTANYL CITRATE (PF) 100 MCG/2ML IJ SOLN
INTRAMUSCULAR | Status: AC
  Filled 2016-10-24: qty 2

## 2016-10-24 MED ORDER — DEXAMETHASONE SODIUM PHOSPHATE 10 MG/ML IJ SOLN
INTRAMUSCULAR | Status: AC
  Filled 2016-10-24: qty 1

## 2016-10-24 MED ORDER — CHLORHEXIDINE GLUCONATE CLOTH 2 % EX PADS: 6.0000 | MEDICATED_PAD | Freq: Once | CUTANEOUS | Status: DC

## 2016-10-24 MED ORDER — MIDAZOLAM HCL 2 MG/2ML IJ SOLN
INTRAMUSCULAR | Status: AC
  Filled 2016-10-24: qty 2

## 2016-10-24 MED ORDER — BACITRACIN-NEOMYCIN-POLYMYXIN 400-5-5000 EX OINT
TOPICAL_OINTMENT | CUTANEOUS | Status: AC
  Filled 2016-10-24: qty 1

## 2016-10-24 MED ORDER — BUPIVACAINE-EPINEPHRINE 0.5% -1:200000 IJ SOLN
INTRAMUSCULAR | Status: DC | PRN
  Administered 2016-10-24: 10.5 mL

## 2016-10-24 MED ORDER — EPHEDRINE 5 MG/ML INJ
INTRAVENOUS | Status: AC
  Filled 2016-10-24: qty 10

## 2016-10-24 MED ORDER — LIDOCAINE HCL (CARDIAC) 20 MG/ML IV SOLN
INTRAVENOUS | Status: DC | PRN
  Administered 2016-10-24: 30 mg via INTRAVENOUS

## 2016-10-24 MED ORDER — FENTANYL CITRATE (PF) 100 MCG/2ML IJ SOLN: 25.0000 ug | INTRAMUSCULAR | Status: DC | PRN

## 2016-10-24 MED ORDER — ONDANSETRON HCL 4 MG/2ML IJ SOLN
INTRAMUSCULAR | Status: AC
  Filled 2016-10-24: qty 2

## 2016-10-24 MED ORDER — DEXAMETHASONE SODIUM PHOSPHATE 4 MG/ML IJ SOLN
INTRAMUSCULAR | Status: DC | PRN
  Administered 2016-10-24: 10 mg via INTRAVENOUS

## 2016-10-24 MED ORDER — FENTANYL CITRATE (PF) 100 MCG/2ML IJ SOLN: 50.0000 ug | INTRAMUSCULAR | Status: DC | PRN

## 2016-10-24 MED ORDER — FENTANYL CITRATE (PF) 100 MCG/2ML IJ SOLN
INTRAMUSCULAR | Status: DC | PRN
  Administered 2016-10-24: 100 ug via INTRAVENOUS

## 2016-10-24 MED ORDER — PROPOFOL 10 MG/ML IV BOLUS
INTRAVENOUS | Status: DC | PRN
  Administered 2016-10-24: 50 mg via INTRAVENOUS
  Administered 2016-10-24: 150 mg via INTRAVENOUS
  Administered 2016-10-24: 50 mg via INTRAVENOUS

## 2016-10-24 MED ORDER — PROPOFOL 500 MG/50ML IV EMUL
INTRAVENOUS | Status: AC
  Filled 2016-10-24: qty 50

## 2016-10-24 MED FILL — HYDROCODON-APAP 5-325: 5-325 | 3 days supply | Qty: 20 | Fill #0

## 2016-10-24 SURGICAL SUPPLY — 43 items
BENZOIN TINCTURE PRP APPL 2/3 (GAUZE/BANDAGES/DRESSINGS) ×3 IMPLANT
BLADE SURG 15 STRL LF DISP TIS (BLADE) ×1 IMPLANT
BLADE SURG 15 STRL SS (BLADE) ×2
BNDG COHESIVE 4X5 TAN STRL (GAUZE/BANDAGES/DRESSINGS) IMPLANT
BNDG GAUZE ELAST 4 BULKY (GAUZE/BANDAGES/DRESSINGS) IMPLANT
CHLORAPREP W/TINT 26ML (MISCELLANEOUS) ×3 IMPLANT
CLEANER CAUTERY TIP 5X5 PAD (MISCELLANEOUS) IMPLANT
CLOSURE WOUND 1/2 X4 (GAUZE/BANDAGES/DRESSINGS) ×1
COVER BACK TABLE 60X90IN (DRAPES) ×3 IMPLANT
COVER MAYO STAND STRL (DRAPES) ×3 IMPLANT
DECANTER SPIKE VIAL GLASS SM (MISCELLANEOUS) IMPLANT
DRAPE EXTREMITY T 121X128X90 (DRAPE) IMPLANT
DRAPE LAPAROTOMY 100X72 PEDS (DRAPES) ×3 IMPLANT
DRAPE U-SHAPE 76X120 STRL (DRAPES) IMPLANT
DRAPE UTILITY XL STRL (DRAPES) ×3 IMPLANT
DRSG TEGADERM 4X4.75 (GAUZE/BANDAGES/DRESSINGS) IMPLANT
ELECT REM PT RETURN 9FT ADLT (ELECTROSURGICAL) ×3
ELECTRODE REM PT RTRN 9FT ADLT (ELECTROSURGICAL) ×1 IMPLANT
GAUZE SPONGE 4X4 12PLY STRL LF (GAUZE/BANDAGES/DRESSINGS) ×3 IMPLANT
GLOVE BIO SURGEON STRL SZ 6.5 (GLOVE) ×2 IMPLANT
GLOVE BIO SURGEONS STRL SZ 6.5 (GLOVE) ×1
GLOVE BIOGEL PI IND STRL 7.0 (GLOVE) ×2 IMPLANT
GLOVE BIOGEL PI INDICATOR 7.0 (GLOVE) ×4
GLOVE SURG ORTHO 8.0 STRL STRW (GLOVE) ×3 IMPLANT
GOWN STRL REUS W/ TWL LRG LVL3 (GOWN DISPOSABLE) ×1 IMPLANT
GOWN STRL REUS W/ TWL XL LVL3 (GOWN DISPOSABLE) ×1 IMPLANT
GOWN STRL REUS W/TWL LRG LVL3 (GOWN DISPOSABLE) ×2
GOWN STRL REUS W/TWL XL LVL3 (GOWN DISPOSABLE) ×2
NEEDLE HYPO 25X1 1.5 SAFETY (NEEDLE) ×3 IMPLANT
PACK BASIN DAY SURGERY FS (CUSTOM PROCEDURE TRAY) ×3 IMPLANT
PAD CLEANER CAUTERY TIP 5X5 (MISCELLANEOUS)
PENCIL BUTTON HOLSTER BLD 10FT (ELECTRODE) ×3 IMPLANT
SHEET MEDIUM DRAPE 40X70 STRL (DRAPES) IMPLANT
STOCKINETTE 4X48 STRL (DRAPES) IMPLANT
STRIP CLOSURE SKIN 1/2X4 (GAUZE/BANDAGES/DRESSINGS) ×2 IMPLANT
SUT ETHILON 3 0 PS 1 (SUTURE) IMPLANT
SUT ETHILON 4 0 PS 2 18 (SUTURE) IMPLANT
SUT MNCRL AB 4-0 PS2 18 (SUTURE) ×3 IMPLANT
SUT VICRYL 3-0 CR8 SH (SUTURE) ×3 IMPLANT
SUT VICRYL 4-0 PS2 18IN ABS (SUTURE) IMPLANT
SYR CONTROL 10ML LL (SYRINGE) ×3 IMPLANT
TOWEL OR 17X24 6PK STRL BLUE (TOWEL DISPOSABLE) ×6 IMPLANT
TOWEL OR NON WOVEN STRL DISP B (DISPOSABLE) ×3 IMPLANT

## 2016-10-24 NOTE — Interval H&P Note (Signed)
History and Physical Interval Note:  10/24/2016 7:06 AM  Sherron Monday  has presented today for surgery, with the diagnosis of enlarging soft tissue mass  The various methods of treatment have been discussed with the patient and family. After consideration of risks, benefits and other options for treatment, the patient has consented to    Procedure(s): EXCISION SOFT TISSUE MASS RIGHT UPPER BACK (Right) as a surgical intervention .    The patient's history has been reviewed, patient examined, no change in status, stable for surgery.  I have reviewed the patient's chart and labs.  Questions were answered to the patient's satisfaction.    Earnstine Regal, MD, [facility] Office: [place]

## 2016-10-24 NOTE — Op Note (Signed)
NAME:  Penny Rasmussen, Penny Rasmussen NO.:  MEDICAL RECORD NO.:  [id number]  LOCATION:                                 FACILITY:  PHYSICIAN:  Earnstine Regal, MD           DATE OF BIRTH:  DATE OF PROCEDURE:  10/24/2016                              OPERATIVE REPORT   PREOPERATIVE DIAGNOSES:  Soft tissue mass, right upper back (5.5 x 4.0 x 2.0 cm), skin tag in left scapula region.  PREOPERATIVE DIAGNOSES:  Soft tissue mass, right upper back (5.5 x 4.0 x 2.0 cm), skin tag in left scapula region.  PROCEDURES: 1. Excision, soft tissue mass, right upper back, subcutaneous. 2. Excision, skin tag, left scapular region.  SURGEON:  Earnstine Regal, MD, FACS  ANESTHESIA:  General.  ESTIMATED BLOOD LOSS:  Minimal.  PREPARATION:  ChloraPrep.  COMPLICATIONS:  None.  INDICATIONS:  The patient is a 56 year old female with an enlarging soft tissue mass in the right upper back, which has become symptomatic. There was also an enlarging skin tag on the left shoulder.  She now comes to Surgery for excision.  BODY OF REPORT:  Procedure was done in OR #6 at the [facility].  The patient was brought to the operating room and placed in supine position on the operating room table.  Following administration of general anesthesia, the patient was positioned and then prepped and draped in the usual aseptic fashion.  After ascertaining that an adequate level of anesthesia had been achieved, an incision was made over the soft tissue mass with a #15 blade. Dissection was carried into subcutaneous tissues.  There was a multilobulated mass, which on excision measured 5.5 x 4.0 x 2.0 cm in size.  It was gently mobilized from the subcutaneous tissues down to the underlying fascia.  It was excised using the electrocautery for hemostasis.  The mass was submitted in its entirety to Pathology for review.  Good hemostasis was obtained throughout the wound.  Local anesthetic  was infiltrated in the skin and subcutaneous tissues.  Skin was closed with interrupted 3-0 Vicryl sutures followed by a running 4-0 Monocryl subcuticular suture.  Wound was washed and dried, and Steri-Strips were applied.  There was a skin tag on the left scapular region.  It measured approximately 5 mm in diameter.  It was excised in its entirety with the electrocautery, and hemostasis was achieved with electrocautery.  Local anesthetic was infiltrated under the base of the skin tag.  Wounds were washed and dried.  Sterile dressings were applied.  The patient was awakened from anesthesia and brought to the recovery room. The patient tolerated the procedure well.   Earnstine Regal, MD, [facility] Office: [phone]    TMG/MEDQ  D:  10/24/2016  T:  10/24/2016  Job:  [id number]

## 2016-10-24 NOTE — Anesthesia Procedure Notes (Signed)
Procedure Name: LMA Insertion Date/Time: 10/24/2016 7:31 AM Performed by: Toula Moos L Pre-anesthesia Checklist: Patient identified, Emergency Drugs available, Suction available, Patient being monitored and Timeout performed Patient Re-evaluated:Patient Re-evaluated prior to induction Oxygen Delivery Method: Circle system utilized Preoxygenation: Pre-oxygenation with 100% oxygen Induction Type: IV induction Ventilation: Mask ventilation without difficulty LMA: LMA inserted LMA Size: 4.0 Number of attempts: 1 Airway Equipment and Method: Bite block Placement Confirmation: positive ETCO2 Tube secured with: Tape Dental Injury: Teeth and Oropharynx as per pre-operative assessment

## 2016-10-24 NOTE — H&P (Signed)
[facility]  Penny Rasmussen DOB: 1960-03-14 Undefined / Language: Penny Rasmussen / Race: White Female   History of Present Illness  The patient is a 56 year old female who presents with a soft tissue mass.  CC: soft tissue mass on back  Patient is referred by Dr. Rachell Cipro for evaluation of an enlarging soft tissue mass on the right upper back. Patient states that the mass is been present for approximately 3 years. It is gradually increasing in size. It does cause some discomfort when seated in a high backed chair or when lying supine. It causes some itching. Patient has had no prior such lesions removed. She relates no history of trauma. She has no other such lesions present. She desires surgical excision for relief of symptoms and definitive diagnosis.   Past Surgical History  Cesarean Section - Multiple  Hysterectomy (not due to cancer) - Partial   Diagnostic Studies History  Colonoscopy  1-5 years ago Mammogram  within last year Pap Smear  1-5 years ago  Allergies  No Known Drug Allergies 08/14/2016 Allergies Reconciled   Medication History Escitalopram Oxalate (20MG  Tablet, Oral) Active. Atorvastatin Calcium (40MG  Tablet, Oral) Active. AmLODIPine Besylate (5MG  Tablet, Oral) Active. Zolpidem Tartrate (5MG  Tablet, Oral) Active. Medications Reconciled  Social History Alcohol use  Occasional alcohol use. Caffeine use  Carbonated beverages, Coffee. No drug use  Tobacco use  Never smoker.  Family History  Depression  Sister. Diabetes Mellitus  Mother. Heart Disease  Mother. Heart disease in female family member before age 29  Hypertension  Mother. Melanoma  Father.  Pregnancy / Birth History  Age at menarche  69 years. Age of menopause  <43 Gravida  4 Length (months) of breastfeeding  3-6 Maternal age  60-30 Para  4 Regular periods   Other Problems Hypercholesterolemia      Review of Systems  General Not Present- Appetite Loss, Chills, Fatigue, Fever, Night Sweats, Weight Gain and Weight Loss. Skin Present- Dryness. Not Present- Change in Wart/Mole, Hives, Jaundice, New Lesions, Non-Healing Wounds, Rash and Ulcer. HEENT Present- Wears glasses/contact lenses. Not Present- Earache, Hearing Loss, Hoarseness, Nose Bleed, Oral Ulcers, Ringing in the Ears, Seasonal Allergies, Sinus Pain, Sore Throat, Visual Disturbances and Yellow Eyes. Respiratory Not Present- Bloody sputum, Chronic Cough, Difficulty Breathing, Snoring and Wheezing. Breast Not Present- Breast Mass, Breast Pain, Nipple Discharge and Skin Changes. Cardiovascular Not Present- Chest Pain, Difficulty Breathing Lying Down, Leg Cramps, Palpitations, Rapid Heart Rate, Shortness of Breath and Swelling of Extremities. Gastrointestinal Not Present- Abdominal Pain, Bloating, Bloody Stool, Change in Bowel Habits, Chronic diarrhea, Constipation, Difficulty Swallowing, Excessive gas, Gets full quickly at meals, Hemorrhoids, Indigestion, Nausea, Rectal Pain and Vomiting. Female Genitourinary Not Present- Frequency, Nocturia, Painful Urination, Pelvic Pain and Urgency. Musculoskeletal Not Present- Back Pain, Joint Pain, Joint Stiffness, Muscle Pain, Muscle Weakness and Swelling of Extremities. Neurological Not Present- Decreased Memory, Fainting, Headaches, Numbness, Seizures, Tingling, Tremor, Trouble walking and Weakness. Psychiatric Not Present- Anxiety, Bipolar, Change in Sleep Pattern, Depression, Fearful and Frequent crying. Endocrine Not Present- Cold Intolerance, Excessive Hunger, Hair Changes, Heat Intolerance, Hot flashes and New Diabetes. Hematology Not Present- Blood Thinners, Easy Bruising, Excessive bleeding, Gland problems, HIV and Persistent Infections.  Vitals Weight: 160.6 lb Height: 62in Body Surface Area: 1.74 m Body Mass Index: 29.37 kg/m  Temp.: 72F  Pulse: 92 (Regular)  BP:  120/80 (Sitting, Left Arm, Standard)  Physical Exam The physical exam findings are as follows: Note:CONSTITUTIONAL See vital signs recorded above  GENERAL APPEARANCE Development: normal Nutritional status: normal Gross deformities: none  SKIN Rash, lesions, ulcers: none Induration, erythema: none Nodules: none palpable In the right upper back medial to the scapula is a 6 cm x 4 cm x 2 cm soft tissue mass. This is well defined and somewhat mobile. It is nontender. There are no overlying cutaneous changes.  EYES Conjunctiva and lids: normal Pupils: equal and reactive Iris: normal bilaterally  EARS, NOSE, MOUTH, THROAT External ears: no lesion or deformity External nose: no lesion or deformity Hearing: grossly normal Lips: no lesion or deformity Dentition: normal for age Oral mucosa: moist  NECK Symmetric: yes Trachea: midline Thyroid: No palpable nodules in the thyroid bed  CHEST Respiratory effort: normal Retraction or accessory muscle use: no Breath sounds: normal bilaterally Rales, rhonchi, wheeze: none  CARDIOVASCULAR Auscultation: regular rhythm, normal rate Murmurs: none Pulses: carotid and radial pulse 2+ palpable Lower extremity edema: none Lower extremity varicosities: none  MUSCULOSKELETAL Station and gait: normal Digits and nails: no clubbing or cyanosis Muscle strength: grossly normal all extremities Range of motion: grossly normal all extremities Deformity: none  LYMPHATIC Cervical: none palpable Supraclavicular: none palpable  PSYCHIATRIC Oriented to person, place, and time: yes Mood and affect: normal for situation Judgment and insight: appropriate for situation    Assessment & Plan  SOFT TISSUE MASS (M79.9)  Patient presents for evaluation of an enlarging soft tissue mass on the right upper back. This has been present for approximately 3 years and gradually increasing in size. It causes some discomfort. It causes some itching.  Patient presents today to discuss surgical excision.  I recommended excision under sedation and local anesthesia as an outpatient surgical procedure. We have discussed the location of the surgical incision. We have discussed possibility of seroma formation after surgery. We have discussed minor restrictions on her activities after the procedure. She understands and wishes to proceed with surgery as an outpatient sometime later this summer. We will make arrangements at a time convenient for the patient.  The risks and benefits of the procedure have been discussed at length with the patient. The patient understands the proposed procedure, potential alternative treatments, and the course of recovery to be expected. All of the patient's questions have been answered at this time. The patient wishes to proceed with surgery.  Earnstine Regal, MD, [facility] Office: [phone]

## 2016-10-24 NOTE — Anesthesia Postprocedure Evaluation (Signed)
Anesthesia Post Note  Patient: Penny Rasmussen  Procedure(s) Performed: Procedure(s) (LRB): EXCISION SOFT TISSUE MASS RIGHT UPPER BACK (Right)     Patient location during evaluation: PACU Anesthesia Type: General Level of consciousness: awake and alert Pain management: pain level controlled Vital Signs Assessment: post-procedure vital signs reviewed and stable Respiratory status: spontaneous breathing, nonlabored ventilation and respiratory function stable Cardiovascular status: blood pressure returned to baseline and stable Postop Assessment: no signs of nausea or vomiting Anesthetic complications: no    Last Vitals:  Vitals:   10/24/16 0845 10/24/16 0900  BP: (!) 145/87 (!) 137/98  Pulse: 83 84  Resp: 17 19  Temp:    SpO2: 100% 98%    Last Pain:  Vitals:   10/24/16 0945  TempSrc:   PainSc: 0-No pain                 Catalina Gravel

## 2016-10-24 NOTE — Brief Op Note (Signed)
10/24/2016  8:11 AM  PATIENT:  Penny Rasmussen  56 y.o. female  PRE-OPERATIVE DIAGNOSIS:  enlarging soft tissue mass  POST-OPERATIVE DIAGNOSIS:  enlarging soft tissue mass  PROCEDURE:  Procedure(s): EXCISION SOFT TISSUE MASS RIGHT UPPER BACK (Right)  SURGEON:  Surgeon(s) and Role:    Armandina Gemma, MD - Primary  ANESTHESIA:   general  EBL:  No intake/output data recorded.  BLOOD ADMINISTERED:none  DRAINS: none   LOCAL MEDICATIONS USED:  MARCAINE     SPECIMEN:  Excision  DISPOSITION OF SPECIMEN:  PATHOLOGY  COUNTS:  YES  TOURNIQUET:  * No tourniquets in log *  DICTATION: .Other Dictation: Dictation Number [id number]  PLAN OF CARE: Discharge to home after PACU  PATIENT DISPOSITION:  PACU - hemodynamically stable.   Delay start of Pharmacological VTE agent (>24hrs) due to surgical blood loss or risk of bleeding: yes  Earnstine Regal, MD, [facility] Office: [phone]

## 2016-10-24 NOTE — Anesthesia Preprocedure Evaluation (Addendum)
Anesthesia Evaluation  Patient identified by MRN, date of birth, ID band Patient awake    Reviewed: Allergy & Precautions, NPO status , Patient's Chart, lab work & pertinent test results  History of Anesthesia Complications (+) POST - OP SPINAL HEADACHE and history of anesthetic complications  Airway Mallampati: II  TM Distance: >3 FB Neck ROM: Full    Dental  (+) Teeth Intact, Dental Advisory Given   Pulmonary neg pulmonary ROS,    Pulmonary exam normal breath sounds clear to auscultation       Cardiovascular hypertension, Pt. on medications Normal cardiovascular exam Rhythm:Regular Rate:Normal     Neuro/Psych  Headaches,    GI/Hepatic negative GI ROS, Neg liver ROS,   Endo/Other  negative endocrine ROS  Renal/GU Renal disease (Glomerulonephritis)     Musculoskeletal  (+) Arthritis ,   Abdominal   Peds  (+) ADHD Hematology negative hematology ROS (+)   Anesthesia Other Findings Day of surgery medications reviewed with the patient.  Reproductive/Obstetrics                            Anesthesia Physical Anesthesia Plan  ASA: II  Anesthesia Plan: General   Post-op Pain Management:    Induction: Intravenous  PONV Risk Score and Plan: 3 and Ondansetron, Dexamethasone, Midazolam and Treatment may vary due to age or medical condition  Airway Management Planned: LMA  Additional Equipment:   Intra-op Plan:   Post-operative Plan: Extubation in OR  Informed Consent: I have reviewed the patients History and Physical, chart, labs and discussed the procedure including the risks, benefits and alternatives for the proposed anesthesia with the patient or authorized representative who has indicated his/her understanding and acceptance.   Dental advisory given  Plan Discussed with: CRNA and Anesthesiologist  Anesthesia Plan Comments: (D)       Anesthesia Quick Evaluation

## 2016-10-24 NOTE — Transfer of Care (Signed)
Immediate Anesthesia Transfer of Care Note  Patient: Penny Rasmussen  Procedure(s) Performed: Procedure(s): EXCISION SOFT TISSUE MASS RIGHT UPPER BACK (Right)  Patient Location: PACU  Anesthesia Type:General  Level of Consciousness: awake and patient cooperative  Airway & Oxygen Therapy: Patient Spontanous Breathing and Patient connected to face mask oxygen  Post-op Assessment: Report given to RN and Post -op Vital signs reviewed and stable  Post vital signs: Reviewed and stable  Last Vitals:  Vitals:   10/24/16 0643  BP: (!) 159/98  Pulse: 66  Resp: 18  Temp: 36.5 C  SpO2: 98%    Last Pain:  Vitals:   10/24/16 0643  TempSrc: Oral         Complications: No apparent anesthesia complications

## 2016-10-25 ENCOUNTER — Encounter (HOSPITAL_BASED_OUTPATIENT_CLINIC_OR_DEPARTMENT_OTHER): Payer: Self-pay | Admitting: Surgery

## 2016-10-29 MED FILL — ZOLPIDEM TARTRATE 10 MG TAB: 10 | 30 days supply | Qty: 30 | Fill #0

## 2016-11-06 MED FILL — ALPRAZolam 0.25 MG TABS: 0.25 | 8 days supply | Qty: 45 | Fill #0

## 2016-11-21 MED FILL — ATORVASTATIN 40 MG TABLET: 40 | 90 days supply | Qty: 90 | Fill #1

## 2016-11-21 MED FILL — AMLODIPINE BESYLATE 5 MG TA: 5 | 90 days supply | Qty: 90 | Fill #2

## 2016-11-26 MED FILL — ESCITALOPRAM 20 MG TABLET: 20 | 90 days supply | Qty: 90 | Fill #1

## 2016-11-26 MED FILL — ZOLPIDEM TARTRATE 10 MG TAB: 10 | 30 days supply | Qty: 30 | Fill #1

## 2017-02-13 ENCOUNTER — Other Ambulatory Visit: Payer: Self-pay | Admitting: Family Medicine

## 2017-02-13 DIAGNOSIS — Z1231 Encounter for screening mammogram for malignant neoplasm of breast: Secondary | ICD-10-CM

## 2017-03-12 MED FILL — AMLODIPINE BESYLATE 5 MG TA: 5 | 90 days supply | Qty: 90 | Fill #3

## 2017-03-17 MED FILL — ESCITALOPRAM 20 MG TABLET: 20 | 90 days supply | Qty: 90 | Fill #2

## 2017-03-21 ENCOUNTER — Ambulatory Visit
Admission: RE | Admit: 2017-03-21 | Discharge: 2017-03-21 | Disposition: A | Discharge status: Home / Self Care | Payer: 59 | Source: Ambulatory Visit | Attending: Family Medicine | Admitting: Family Medicine

## 2017-03-21 DIAGNOSIS — Z1231 Encounter for screening mammogram for malignant neoplasm of breast: Secondary | ICD-10-CM

## 2017-03-24 MED FILL — ATORVASTATIN 40 MG TABLET: 40 | 90 days supply | Qty: 90 | Fill #2

## 2017-04-01 MED FILL — ZOLPIDEM TARTRATE 10 MG TAB: 10 | 30 days supply | Qty: 30 | Fill #2

## 2017-04-08 MED FILL — FLUOROURACIL 5% CREAM: 5 | 14 days supply | Qty: 40 | Fill #0

## 2017-04-24 MED FILL — ZOLPIDEM TARTRATE 10 MG TAB: 10 | 30 days supply | Qty: 30 | Fill #3

## 2017-06-24 MED FILL — ATORVASTATIN 40 MG TABLET: 40 | 90 days supply | Qty: 90 | Fill #3

## 2017-06-24 MED FILL — ESCITALOPRAM 20 MG TABLET: 20 | 90 days supply | Qty: 90 | Fill #3

## 2017-06-24 MED FILL — AMLODIPINE BESYLATE 5 MG TA: 5 | 90 days supply | Qty: 90 | Fill #0

## 2017-08-01 ENCOUNTER — Telehealth (INDEPENDENT_AMBULATORY_CARE_PROVIDER_SITE_OTHER): Payer: Self-pay | Admitting: Orthopaedic Surgery

## 2017-08-01 NOTE — Telephone Encounter (Signed)
Dr. Nicole Cella wife called wanting to get an appointment with Dr. Ninfa Linden asap, told her he was booked the next week and on vacation the next, offered her an appointment with Artis Delay but she really wants to see Dr. Ninfa Linden. Asked for me to message you and see what you could do for her? CB # [phone] It would be an appointment for her knee she fell the other day

## 2017-08-01 NOTE — Telephone Encounter (Signed)
I worked patient in for 1pm on Monday

## 2017-08-04 ENCOUNTER — Encounter (INDEPENDENT_AMBULATORY_CARE_PROVIDER_SITE_OTHER): Payer: Self-pay | Admitting: Orthopaedic Surgery

## 2017-08-04 ENCOUNTER — Ambulatory Visit (INDEPENDENT_AMBULATORY_CARE_PROVIDER_SITE_OTHER): Payer: No Typology Code available for payment source | Admitting: Orthopaedic Surgery

## 2017-08-04 ENCOUNTER — Ambulatory Visit (INDEPENDENT_AMBULATORY_CARE_PROVIDER_SITE_OTHER): Payer: No Typology Code available for payment source

## 2017-08-04 DIAGNOSIS — M25562 Pain in left knee: Secondary | ICD-10-CM

## 2017-08-04 MED ORDER — LIDOCAINE HCL 1 % IJ SOLN
3.0000 mL | INTRAMUSCULAR | Status: AC | PRN
  Administered 2017-08-04: 3 mL

## 2017-08-04 MED ORDER — METHYLPREDNISOLONE ACETATE 40 MG/ML IJ SUSP
40.0000 mg | INTRAMUSCULAR | Status: AC | PRN
  Administered 2017-08-04: 40 mg via INTRA_ARTICULAR

## 2017-08-04 NOTE — Progress Notes (Signed)
Office Visit Note   Patient: Penny Rasmussen           Date of Birth: 10-17-1960           MRN: [id number] Visit Date: 08/04/2017              Requested by: Fanny Bien, [address] [place], [place] [address] PCP: Fanny Bien, MD   Assessment & Plan: Visit Diagnoses:  1. Left knee pain, unspecified chronicity     Plan: This is likely  a deep bone bruise and a contusion to the knee.  She would likely benefit from at least an intra-articular steroid injection to see if this helps with her symptoms.  If she is having problems with her continuing after 2 weeks she will let us know because I would then obtain an MRI of her knee based on normal plain films.  I explained the risk and benefits of injections and she tolerated this well.  She will follow-up as needed however  Follow-Up Instructions: Return if symptoms worsen or fail to improve.   Orders:  Orders Placed This Encounter  Procedures  . Large Joint Inj  . XR Knee 1-2 Views Left   No orders of the defined types were placed in this encounter.     Procedures: Large Joint Inj: L knee on 08/04/2017 1:19 PM Indications: diagnostic evaluation and pain Details: 22 G 1.5 in needle, superolateral approach  Arthrogram: No  Medications: 3 mL lidocaine 1 %; 40 mg methylPREDNISolone acetate 40 MG/ML Outcome: tolerated well, no immediate complications Procedure, treatment alternatives, risks and benefits explained, specific risks discussed. Consent was given by the patient. Immediately prior to procedure a time out was called to verify the correct patient, procedure, equipment, support staff and site/side marked as required. Patient was prepped and draped in the usual sterile fashion.       Clinical Data: No additional findings.   Subjective: Chief Complaint  Patient presents with  . Left Knee - Pain  The patient comes in today with acute left knee pain after injury that happened about 2 -3 weeks  ago she was running and tripped in a wood call and hit against her left knee.  She showed me the area of her knee just medial to the patella where she was hit hard.  She did feel like she heard a pop in her knee as well.  She is had limping since then and pain in the knee is really started about her quite a bit even behind the knee in terms of the popliteal area where she points to.  She is never had any knee issues before this injury.  HPI  Review of Systems She currently denies any headache, chest pain, shortness of breath, fever, chills, nausea, vomiting.  Objective: Vital Signs: There were no vitals taken for this visit.  Physical Exam She is alert and oriented x3 and in no acute distress Ortho Exam Examination of the left knee does show bruising just medial to the patella.  She has some slight medial joint tenderness but no effusion.  She has full flexion-extension of her left knee.  McMurray's and Lachman's exams are all normal. Specialty Comments:  No specialty comments available.  Imaging: Xr Knee 1-2 Views Left  Result Date: 08/04/2017 2 views of the left knee show no acute findings.  The joint space is well-maintained and the alignment is neutral.    PMFS History: Patient Active Problem List  Diagnosis Date Noted  . Soft tissue mass 10/24/2016  . PALPITATIONS 05/30/2009  . CHEST PAIN 05/30/2009   Past Medical History:  Diagnosis Date  . ADHD (attention deficit hyperactivity disorder)   . Arthritis    osteoarthritis hands  . Complication of anesthesia   . Glomerulonephritis   . HLD (hyperlipidemia)   . Hypertension   . Spinal headache    with c-section    Family History  Problem Relation Age of Onset  . Coronary artery disease Other   . Peripheral vascular disease Other        Peripheral arterial disease    Past Surgical History:  Procedure Laterality Date  . CESAREAN SECTION     x4  . LIPOMA EXCISION Right 10/24/2016   Procedure: EXCISION SOFT TISSUE  MASS RIGHT UPPER BACK;  Surgeon: Armandina Gemma, MD;  Location: [facility];  Service: General;  Laterality: Right;   Social History   Occupational History  . Not on file  Tobacco Use  . Smoking status: Never Smoker  . Smokeless tobacco: Never Used  Substance and Sexual Activity  . Alcohol use: Yes    Comment: social  . Drug use: No  . Sexual activity: Not on file

## 2017-09-02 ENCOUNTER — Ambulatory Visit (INDEPENDENT_AMBULATORY_CARE_PROVIDER_SITE_OTHER): Payer: No Typology Code available for payment source | Admitting: Orthopaedic Surgery

## 2017-09-02 ENCOUNTER — Ambulatory Visit (INDEPENDENT_AMBULATORY_CARE_PROVIDER_SITE_OTHER): Payer: No Typology Code available for payment source

## 2017-09-02 ENCOUNTER — Encounter (INDEPENDENT_AMBULATORY_CARE_PROVIDER_SITE_OTHER): Payer: Self-pay | Admitting: Orthopaedic Surgery

## 2017-09-02 DIAGNOSIS — M25532 Pain in left wrist: Secondary | ICD-10-CM

## 2017-09-02 DIAGNOSIS — S62317A Displaced fracture of base of fifth metacarpal bone. left hand, initial encounter for closed fracture: Secondary | ICD-10-CM

## 2017-09-02 DIAGNOSIS — M79642 Pain in left hand: Secondary | ICD-10-CM | POA: Diagnosis not present

## 2017-09-02 NOTE — Progress Notes (Addendum)
Office Visit Note   Patient: Penny Rasmussen           Date of Birth: 1960-06-22           MRN: [id number] Visit Date: 09/02/2017              Requested by: Fanny Bien, [address] [place], [place] [address] PCP: Fanny Bien, MD   Assessment & Plan: Visit Diagnoses:  1. Pain in left wrist   2. Pain in left hand   3. Closed displaced fracture of base of fifth metacarpal bone of left hand, initial encounter     Plan: Place her in a removable Velcro wrist splint.  She is to avoid high impact activities on the left wrist and heavy lifting.  We will see her back in 3 weeks at that time we will obtain 3 views of the left hand.  Questions encouraged and answered.  Follow-Up Instructions: Return in about 3 weeks (around 09/23/2017) for Radiographs.   Orders:  Orders Placed This Encounter  Procedures  . XR Hand Complete Left   No orders of the defined types were placed in this encounter.     Procedures: No procedures performed   Clinical Data: No additional findings.   Subjective: Chief Complaint  Patient presents with  . Left Wrist - Pain, Injury    HPI Penny Rasmussen comes in today due to left wrist hand pain due to a fall 3 weeks ago while in [place].  She fell on outstretched hand.  No loss of consciousness dizziness.  She states that her hand and wrist remain swollen. She wants the hand and wrist x-ray x-rayed.  She had a bruise to her knee but the knee overall is doing well. Review of Systems Please see HPI otherwise negative  Objective: Vital Signs: There were no vitals taken for this visit.  Physical Exam  Constitutional: She is oriented to person, place, and time. She appears well-developed and well-nourished. No distress.  Pulmonary/Chest: Effort normal.  Neurological: She is alert and oriented to person, place, and time.  Skin: She is not diaphoretic.  Psychiatric: She has a normal mood and affect.    Ortho Exam Left wrist  good  dorsiflexion and slightly decreased extension of the wrist on the left compared to the right.   Full motion of the hand and able to make a fist.  Sensation grossly intact throughout the hand and  motor grossly intact throughout the left hand.  Slight edema left hand compared to the right.  Tenderness over the base the fifth metacarpal left hand.  Remainder of the left wrist and hand nontender.  No skin lesions ulcerations ecchymosis or erythema left hand and wrist.  Specialty Comments:  No specialty comments available.  Imaging: Xr Hand Complete Left  Result Date: 09/02/2017 Left wrist and hand 3 views: Minimally displaced base the fifth metacarpal shaft fracture.  There is no intra-articular involvement.  No other acute fractures throughout the hand.  Left wrist is without fracture or dislocation.    PMFS History: Patient Active Problem List   Diagnosis Date Noted  . Soft tissue mass 10/24/2016  . PALPITATIONS 05/30/2009  . CHEST PAIN 05/30/2009   Past Medical History:  Diagnosis Date  . ADHD (attention deficit hyperactivity disorder)   . Arthritis    osteoarthritis hands  . Complication of anesthesia   . Glomerulonephritis   . HLD (hyperlipidemia)   . Hypertension   . Spinal headache  with c-section    Family History  Problem Relation Age of Onset  . Coronary artery disease Other   . Peripheral vascular disease Other        Peripheral arterial disease    Past Surgical History:  Procedure Laterality Date  . CESAREAN SECTION     x4  . LIPOMA EXCISION Right 10/24/2016   Procedure: EXCISION SOFT TISSUE MASS RIGHT UPPER BACK;  Surgeon: Armandina Gemma, MD;  Location: [facility];  Service: General;  Laterality: Right;   Social History   Occupational History  . Not on file  Tobacco Use  . Smoking status: Never Smoker  . Smokeless tobacco: Never Used  Substance and Sexual Activity  . Alcohol use: Yes    Comment: social  . Drug use: No  . Sexual activity:  Not on file

## 2017-09-09 MED FILL — BENZONATATE 200 MG CAPSULE: 200 | 10 days supply | Qty: 30 | Fill #0

## 2017-09-09 MED FILL — VENTOLIN HFA 90 MCG INHALER: 108 (90 BAS | 17 days supply | Qty: 18 | Fill #0

## 2017-09-09 MED FILL — predniSONE 10 MG TABS: 10 | 5 days supply | Qty: 15 | Fill #0

## 2017-09-23 ENCOUNTER — Ambulatory Visit (INDEPENDENT_AMBULATORY_CARE_PROVIDER_SITE_OTHER): Payer: No Typology Code available for payment source | Admitting: Orthopaedic Surgery

## 2017-10-02 MED FILL — ESCITALOPRAM 20 MG TABLET: 20 | 30 days supply | Qty: 30 | Fill #0

## 2017-10-02 MED FILL — AMLODIPINE BESYLATE 5 MG TA: 5 | 30 days supply | Qty: 30 | Fill #0

## 2017-10-02 MED FILL — ATORVASTATIN 40 MG TABLET: 40 | 30 days supply | Qty: 30 | Fill #0

## 2017-11-07 MED FILL — ATORVASTATIN 40 MG TABLET: 40 | 90 days supply | Qty: 90 | Fill #0

## 2017-11-07 MED FILL — ESCITALOPRAM 20 MG TABLET: 20 | 90 days supply | Qty: 90 | Fill #0

## 2017-11-07 MED FILL — AMLODIPINE BESYLATE 5 MG TA: 5 | 90 days supply | Qty: 90 | Fill #0

## 2017-11-07 MED FILL — ZOLPIDEM TARTRATE 5 MG TAB: 5 | 30 days supply | Qty: 30 | Fill #0

## 2017-11-07 MED FILL — ESTRADIOL 0.1 MG/GM CRM: 0.1 | 84 days supply | Qty: 43 | Fill #0

## 2017-12-05 MED FILL — ZOLPIDEM TARTRATE 5 MG TAB: 5 | 30 days supply | Qty: 30 | Fill #1

## 2018-02-03 MED FILL — AMLODIPINE BESYLATE 5 MG TA: 5 | 90 days supply | Qty: 90 | Fill #1

## 2018-02-03 MED FILL — ESCITALOPRAM 20 MG TABLET: 20 | 90 days supply | Qty: 90 | Fill #1

## 2018-02-03 MED FILL — ATORVASTATIN 40 MG TABLET: 40 | 90 days supply | Qty: 90 | Fill #1

## 2018-02-09 ENCOUNTER — Other Ambulatory Visit: Payer: Self-pay | Admitting: Family Medicine

## 2018-02-09 DIAGNOSIS — Z1231 Encounter for screening mammogram for malignant neoplasm of breast: Secondary | ICD-10-CM

## 2018-02-11 MED FILL — COLCHICINE 0.6 MG TABS: 0.6 | 10 days supply | Qty: 30 | Fill #0

## 2018-03-23 ENCOUNTER — Ambulatory Visit
Admission: RE | Admit: 2018-03-23 | Discharge: 2018-03-23 | Disposition: A | Discharge status: Home / Self Care | Payer: No Typology Code available for payment source | Source: Ambulatory Visit | Attending: Family Medicine | Admitting: Family Medicine

## 2018-03-23 DIAGNOSIS — Z1231 Encounter for screening mammogram for malignant neoplasm of breast: Secondary | ICD-10-CM | POA: Diagnosis not present

## 2018-04-08 DIAGNOSIS — D1801 Hemangioma of skin and subcutaneous tissue: Secondary | ICD-10-CM | POA: Diagnosis not present

## 2018-04-08 DIAGNOSIS — D2272 Melanocytic nevi of left lower limb, including hip: Secondary | ICD-10-CM | POA: Diagnosis not present

## 2018-04-08 DIAGNOSIS — D225 Melanocytic nevi of trunk: Secondary | ICD-10-CM | POA: Diagnosis not present

## 2018-04-08 DIAGNOSIS — L570 Actinic keratosis: Secondary | ICD-10-CM | POA: Diagnosis not present

## 2018-04-08 DIAGNOSIS — L821 Other seborrheic keratosis: Secondary | ICD-10-CM | POA: Diagnosis not present

## 2018-04-08 DIAGNOSIS — L814 Other melanin hyperpigmentation: Secondary | ICD-10-CM | POA: Diagnosis not present

## 2018-04-08 DIAGNOSIS — D2271 Melanocytic nevi of right lower limb, including hip: Secondary | ICD-10-CM | POA: Diagnosis not present

## 2018-04-08 DIAGNOSIS — D485 Neoplasm of uncertain behavior of skin: Secondary | ICD-10-CM | POA: Diagnosis not present

## 2018-04-08 DIAGNOSIS — D692 Other nonthrombocytopenic purpura: Secondary | ICD-10-CM | POA: Diagnosis not present

## 2018-05-12 DIAGNOSIS — E559 Vitamin D deficiency, unspecified: Secondary | ICD-10-CM | POA: Diagnosis not present

## 2018-05-12 DIAGNOSIS — M1000 Idiopathic gout, unspecified site: Secondary | ICD-10-CM | POA: Diagnosis not present

## 2018-05-14 MED FILL — AMLODIPINE BESYLATE 5 MG TA: 5 | 30 days supply | Qty: 30 | Fill #0

## 2018-05-14 MED FILL — ESCITALOPRAM 20 MG TABLET: 20 | 30 days supply | Qty: 30 | Fill #0

## 2018-05-14 MED FILL — ATORVASTATIN 40 MG TABLET: 40 | 30 days supply | Qty: 30 | Fill #0

## 2018-06-19 DIAGNOSIS — I10 Essential (primary) hypertension: Secondary | ICD-10-CM | POA: Diagnosis not present

## 2018-06-19 DIAGNOSIS — E559 Vitamin D deficiency, unspecified: Secondary | ICD-10-CM | POA: Diagnosis not present

## 2018-06-19 DIAGNOSIS — M1000 Idiopathic gout, unspecified site: Secondary | ICD-10-CM | POA: Diagnosis not present

## 2018-06-19 DIAGNOSIS — N19 Unspecified kidney failure: Secondary | ICD-10-CM | POA: Diagnosis not present

## 2018-06-19 DIAGNOSIS — E782 Mixed hyperlipidemia: Secondary | ICD-10-CM | POA: Diagnosis not present

## 2018-06-19 DIAGNOSIS — G4700 Insomnia, unspecified: Secondary | ICD-10-CM | POA: Diagnosis not present

## 2018-06-19 DIAGNOSIS — F330 Major depressive disorder, recurrent, mild: Secondary | ICD-10-CM | POA: Diagnosis not present

## 2018-06-19 MED FILL — ZOLPIDEM TARTRATE 5 MG TAB: 5 | 30 days supply | Qty: 30 | Fill #0 | Status: TO

## 2018-06-19 MED FILL — ESCITALOPRAM 20 MG TABLET: 20 | 90 days supply | Qty: 90 | Fill #0

## 2018-06-19 MED FILL — ATORVASTATIN 40 MG TABLET: 40 | 90 days supply | Qty: 90 | Fill #0

## 2018-06-19 MED FILL — AMLODIPINE BESYLATE 5 MG TA: 5 | 90 days supply | Qty: 90 | Fill #0

## 2018-07-20 DIAGNOSIS — H5203 Hypermetropia, bilateral: Secondary | ICD-10-CM | POA: Diagnosis not present

## 2018-07-28 MED FILL — ZOLPIDEM TARTRATE 5 MG TAB: 5 | 30 days supply | Qty: 30 | Fill #0

## 2018-08-03 MED FILL — TRETINOIN 0.1 % CREA: 0.1 | 30 days supply | Qty: 45 | Fill #0

## 2018-08-03 MED FILL — COLCHICINE 0.6 MG TABS: 0.6 | 30 days supply | Qty: 90 | Fill #0

## 2018-08-25 MED FILL — ZOLPIDEM TARTRATE 5 MG TAB: 5 | 30 days supply | Qty: 30 | Fill #1

## 2018-09-10 MED FILL — ESCITALOPRAM 20 MG TABLET: 20 | 90 days supply | Qty: 90 | Fill #0

## 2018-09-10 MED FILL — ATORVASTATIN 40 MG TABLET: 40 | 90 days supply | Qty: 90 | Fill #0

## 2018-09-10 MED FILL — AMLODIPINE BESYLATE 5 MG TA: 5 | 90 days supply | Qty: 90 | Fill #0

## 2018-10-12 MED FILL — ZOLPIDEM TARTRATE 5 MG TAB: 5 | 30 days supply | Qty: 30 | Fill #2

## 2018-11-02 MED FILL — COLCHICINE 0.6 MG TABS: 0.6 | 30 days supply | Qty: 90 | Fill #1

## 2018-11-03 DIAGNOSIS — R635 Abnormal weight gain: Secondary | ICD-10-CM | POA: Diagnosis not present

## 2018-11-03 DIAGNOSIS — B373 Candidiasis of vulva and vagina: Secondary | ICD-10-CM | POA: Diagnosis not present

## 2018-11-03 DIAGNOSIS — Z719 Counseling, unspecified: Secondary | ICD-10-CM | POA: Diagnosis not present

## 2018-11-03 MED FILL — FLUCONAZOLE 150 MG TABS: 150 | 14 days supply | Qty: 2 | Fill #0

## 2018-11-05 MED FILL — ZOLPIDEM TARTRATE 5 MG TAB: 5 | 30 days supply | Qty: 30 | Fill #3

## 2018-11-06 DIAGNOSIS — Z20828 Contact with and (suspected) exposure to other viral communicable diseases: Secondary | ICD-10-CM | POA: Diagnosis not present

## 2018-12-08 MED FILL — ZOLPIDEM TARTRATE 5 MG TAB: 5 | 30 days supply | Qty: 30 | Fill #4

## 2018-12-08 MED FILL — FLUARIX QUADRIVALENT 0.5 ML: 0.5 | 1 days supply | Qty: 1 | Fill #0

## 2018-12-17 MED FILL — ESCITALOPRAM 20 MG TABLET: 20 | 90 days supply | Qty: 90 | Fill #1

## 2018-12-17 MED FILL — AMLODIPINE BESYLATE 5 MG TA: 5 | 90 days supply | Qty: 90 | Fill #1

## 2018-12-24 MED FILL — ATORVASTATIN 40 MG TABLET: 40 | 90 days supply | Qty: 90 | Fill #1

## 2019-02-17 ENCOUNTER — Other Ambulatory Visit: Payer: Self-pay | Admitting: Family Medicine

## 2019-02-17 DIAGNOSIS — Z1231 Encounter for screening mammogram for malignant neoplasm of breast: Secondary | ICD-10-CM

## 2019-03-29 MED FILL — AMLODIPINE BESYLATE 5 MG TA: 5 | 90 days supply | Qty: 90 | Fill #2

## 2019-03-29 MED FILL — ATORVASTATIN 40 MG TABLET: 40 | 90 days supply | Qty: 90 | Fill #2

## 2019-03-29 MED FILL — ESCITALOPRAM 20 MG TABLET: 20 | 90 days supply | Qty: 90 | Fill #2

## 2019-04-08 ENCOUNTER — Ambulatory Visit
Admission: RE | Admit: 2019-04-08 | Discharge: 2019-04-08 | Disposition: A | Discharge status: Home / Self Care | Payer: No Typology Code available for payment source | Source: Ambulatory Visit | Attending: Family Medicine | Admitting: Family Medicine

## 2019-04-08 ENCOUNTER — Other Ambulatory Visit: Payer: Self-pay

## 2019-04-08 DIAGNOSIS — Z1231 Encounter for screening mammogram for malignant neoplasm of breast: Secondary | ICD-10-CM | POA: Diagnosis not present

## 2019-04-15 DIAGNOSIS — D485 Neoplasm of uncertain behavior of skin: Secondary | ICD-10-CM | POA: Diagnosis not present

## 2019-04-15 DIAGNOSIS — D225 Melanocytic nevi of trunk: Secondary | ICD-10-CM | POA: Diagnosis not present

## 2019-04-15 DIAGNOSIS — L821 Other seborrheic keratosis: Secondary | ICD-10-CM | POA: Diagnosis not present

## 2019-04-15 DIAGNOSIS — L708 Other acne: Secondary | ICD-10-CM | POA: Diagnosis not present

## 2019-04-15 DIAGNOSIS — L814 Other melanin hyperpigmentation: Secondary | ICD-10-CM | POA: Diagnosis not present

## 2019-04-15 DIAGNOSIS — D2262 Melanocytic nevi of left upper limb, including shoulder: Secondary | ICD-10-CM | POA: Diagnosis not present

## 2019-04-15 DIAGNOSIS — L570 Actinic keratosis: Secondary | ICD-10-CM | POA: Diagnosis not present

## 2019-04-15 MED FILL — TRETINOIN 0.05 % CREA: 0.05 | 30 days supply | Qty: 20 | Fill #0

## 2019-05-15 ENCOUNTER — Ambulatory Visit: Payer: No Typology Code available for payment source | Attending: Internal Medicine

## 2019-05-15 DIAGNOSIS — Z23 Encounter for immunization: Secondary | ICD-10-CM

## 2019-05-15 NOTE — Progress Notes (Signed)
   Covid-19 Vaccination Clinic  Name:  Penny Rasmussen    MRN: [id number] DOB: November 13, 1960  05/15/2019  [name] was observed post Covid-19 immunization for 15 minutes without incident. She was provided with Vaccine Information Sheet and instruction to access the V-Safe system.   [name] was instructed to call 911 with any severe reactions post vaccine: Marland Kitchen Difficulty breathing  . Swelling of face and throat  . A fast heartbeat  . A bad rash all over body  . Dizziness and weakness   Immunizations Administered    Name Date Dose VIS Date Route   Pfizer COVID-19 Vaccine 05/15/2019  4:12 PM 0.3 mL 02/12/2019 Intramuscular   Manufacturer: [place]   Lot: [id number]   [place]: [id number]

## 2019-06-08 ENCOUNTER — Ambulatory Visit: Payer: No Typology Code available for payment source

## 2019-06-08 ENCOUNTER — Ambulatory Visit: Payer: Self-pay | Attending: Internal Medicine

## 2019-06-08 DIAGNOSIS — Z23 Encounter for immunization: Secondary | ICD-10-CM

## 2019-06-08 NOTE — Progress Notes (Signed)
   Covid-19 Vaccination Clinic  Name:  Penny Rasmussen    MRN: [id number] DOB: 31-Jan-1961  06/08/2019  [name] was observed post Covid-19 immunization for 15 minutes without incident. She was provided with Vaccine Information Sheet and instruction to access the V-Safe system.   [name] was instructed to call 911 with any severe reactions post vaccine: Marland Kitchen Difficulty breathing  . Swelling of face and throat  . A fast heartbeat  . A bad rash all over body  . Dizziness and weakness   Immunizations Administered    Name Date Dose VIS Date Route   Pfizer COVID-19 Vaccine 06/08/2019 10:31 AM 0.3 mL 02/12/2019 Intramuscular   Manufacturer: [employer], [employer]   Lot: [id number]   [place]: [id number]

## 2019-07-02 MED FILL — AMLODIPINE BESYLATE 5 MG TA: 5 | 30 days supply | Qty: 30 | Fill #0

## 2019-07-02 MED FILL — ESCITALOPRAM 20 MG TABLET: 20 | 30 days supply | Qty: 30 | Fill #0

## 2019-07-02 MED FILL — ATORVASTATIN 40 MG TABLET: 40 | 30 days supply | Qty: 30 | Fill #0

## 2019-07-26 ENCOUNTER — Other Ambulatory Visit (HOSPITAL_COMMUNITY): Payer: Self-pay | Admitting: Family Medicine

## 2019-07-26 DIAGNOSIS — F330 Major depressive disorder, recurrent, mild: Secondary | ICD-10-CM | POA: Diagnosis not present

## 2019-07-26 DIAGNOSIS — R635 Abnormal weight gain: Secondary | ICD-10-CM | POA: Diagnosis not present

## 2019-07-26 DIAGNOSIS — I10 Essential (primary) hypertension: Secondary | ICD-10-CM | POA: Diagnosis not present

## 2019-07-26 DIAGNOSIS — E782 Mixed hyperlipidemia: Secondary | ICD-10-CM | POA: Diagnosis not present

## 2019-07-26 DIAGNOSIS — M1000 Idiopathic gout, unspecified site: Secondary | ICD-10-CM | POA: Diagnosis not present

## 2019-07-26 MED FILL — DICLOFENAC SODIUM 1 % GEL: 1 | 62 days supply | Qty: 500 | Fill #0

## 2019-07-26 MED FILL — ATORVASTATIN 40 MG TABLET: 40 | 90 days supply | Qty: 90 | Fill #0

## 2019-07-26 MED FILL — COLCHICINE 0.6 MG TABS: 0.6 | 30 days supply | Qty: 90 | Fill #0

## 2019-07-26 MED FILL — ESCITALOPRAM 20 MG TABLET: 20 | 90 days supply | Qty: 90 | Fill #0

## 2019-07-26 MED FILL — AMLODIPINE BESYLATE 5 MG TA: 5 | 90 days supply | Qty: 90 | Fill #0

## 2019-08-20 DIAGNOSIS — Z0184 Encounter for antibody response examination: Secondary | ICD-10-CM | POA: Diagnosis not present

## 2019-08-20 DIAGNOSIS — Z0000 Encounter for general adult medical examination without abnormal findings: Secondary | ICD-10-CM | POA: Diagnosis not present

## 2019-08-24 ENCOUNTER — Other Ambulatory Visit (HOSPITAL_COMMUNITY): Payer: Self-pay | Admitting: Family Medicine

## 2019-08-24 DIAGNOSIS — Z0000 Encounter for general adult medical examination without abnormal findings: Secondary | ICD-10-CM | POA: Diagnosis not present

## 2019-08-24 DIAGNOSIS — Z1211 Encounter for screening for malignant neoplasm of colon: Secondary | ICD-10-CM | POA: Diagnosis not present

## 2019-08-24 DIAGNOSIS — N289 Disorder of kidney and ureter, unspecified: Secondary | ICD-10-CM | POA: Diagnosis not present

## 2019-08-24 DIAGNOSIS — Z113 Encounter for screening for infections with a predominantly sexual mode of transmission: Secondary | ICD-10-CM | POA: Diagnosis not present

## 2019-08-24 DIAGNOSIS — Z118 Encounter for screening for other infectious and parasitic diseases: Secondary | ICD-10-CM | POA: Diagnosis not present

## 2019-08-24 DIAGNOSIS — E782 Mixed hyperlipidemia: Secondary | ICD-10-CM | POA: Diagnosis not present

## 2019-08-24 DIAGNOSIS — Z01419 Encounter for gynecological examination (general) (routine) without abnormal findings: Secondary | ICD-10-CM | POA: Diagnosis not present

## 2019-08-24 DIAGNOSIS — R739 Hyperglycemia, unspecified: Secondary | ICD-10-CM | POA: Diagnosis not present

## 2019-08-24 DIAGNOSIS — M109 Gout, unspecified: Secondary | ICD-10-CM | POA: Diagnosis not present

## 2019-08-24 DIAGNOSIS — I10 Essential (primary) hypertension: Secondary | ICD-10-CM | POA: Diagnosis not present

## 2019-08-24 DIAGNOSIS — Z1151 Encounter for screening for human papillomavirus (HPV): Secondary | ICD-10-CM | POA: Diagnosis not present

## 2019-08-24 MED FILL — FENOFIBRATE 160 MG TABLET: 160 | 90 days supply | Qty: 90 | Fill #0

## 2019-08-24 MED FILL — ROSUVASTATIN CALCIUM 20 MG: 20 | 90 days supply | Qty: 90 | Fill #0

## 2019-09-20 DIAGNOSIS — J40 Bronchitis, not specified as acute or chronic: Secondary | ICD-10-CM | POA: Diagnosis not present

## 2019-09-20 DIAGNOSIS — J069 Acute upper respiratory infection, unspecified: Secondary | ICD-10-CM | POA: Diagnosis not present

## 2019-09-20 MED FILL — AZITHROMYCIN 250 MG TABS: 250 | 5 days supply | Qty: 6 | Fill #0

## 2019-09-21 DIAGNOSIS — L03019 Cellulitis of unspecified finger: Secondary | ICD-10-CM | POA: Diagnosis not present

## 2019-09-21 DIAGNOSIS — L02519 Cutaneous abscess of unspecified hand: Secondary | ICD-10-CM | POA: Diagnosis not present

## 2019-09-21 MED FILL — CEPHALEXIN 500 MG CAPSULE: 500 | 10 days supply | Qty: 30 | Fill #0

## 2019-09-21 MED FILL — GUAIATUSSIN AC LIQUID: 100-10 | 3 days supply | Qty: 150 | Fill #0

## 2019-09-23 ENCOUNTER — Other Ambulatory Visit: Payer: Self-pay

## 2019-09-23 ENCOUNTER — Other Ambulatory Visit: Payer: Self-pay | Admitting: Orthopedic Surgery

## 2019-09-23 ENCOUNTER — Encounter (HOSPITAL_BASED_OUTPATIENT_CLINIC_OR_DEPARTMENT_OTHER): Payer: Self-pay | Admitting: Orthopedic Surgery

## 2019-09-23 DIAGNOSIS — M109 Gout, unspecified: Secondary | ICD-10-CM | POA: Diagnosis not present

## 2019-09-23 DIAGNOSIS — M10042 Idiopathic gout, left hand: Secondary | ICD-10-CM | POA: Diagnosis not present

## 2019-09-23 DIAGNOSIS — M79645 Pain in left finger(s): Secondary | ICD-10-CM | POA: Diagnosis not present

## 2019-09-23 DIAGNOSIS — M009 Pyogenic arthritis, unspecified: Secondary | ICD-10-CM | POA: Diagnosis not present

## 2019-09-23 MED FILL — DOXYCYCLINE HYCLATE 100 MG: 100 | 7 days supply | Qty: 14 | Fill #0

## 2019-09-24 ENCOUNTER — Other Ambulatory Visit (HOSPITAL_COMMUNITY)
Admission: RE | Admit: 2019-09-24 | Discharge: 2019-09-24 | Disposition: A | Discharge status: Home / Self Care | Payer: 59 | Source: Ambulatory Visit | Attending: Orthopedic Surgery | Admitting: Orthopedic Surgery

## 2019-09-24 ENCOUNTER — Encounter (HOSPITAL_BASED_OUTPATIENT_CLINIC_OR_DEPARTMENT_OTHER): Payer: Self-pay | Admitting: Orthopedic Surgery

## 2019-09-24 ENCOUNTER — Ambulatory Visit (HOSPITAL_BASED_OUTPATIENT_CLINIC_OR_DEPARTMENT_OTHER): Payer: 59 | Admitting: Anesthesiology

## 2019-09-24 ENCOUNTER — Encounter (HOSPITAL_BASED_OUTPATIENT_CLINIC_OR_DEPARTMENT_OTHER)
Admission: RE | Disposition: A | Discharge status: Home / Self Care | Payer: Self-pay | Source: Home / Self Care | Attending: Orthopedic Surgery

## 2019-09-24 ENCOUNTER — Other Ambulatory Visit: Payer: Self-pay

## 2019-09-24 ENCOUNTER — Ambulatory Visit (HOSPITAL_COMMUNITY)
Admission: RE | Admit: 2019-09-24 | Discharge: 2019-09-24 | Disposition: A | Discharge status: Home / Self Care | Payer: 59 | Attending: Orthopedic Surgery | Admitting: Orthopedic Surgery

## 2019-09-24 DIAGNOSIS — F909 Attention-deficit hyperactivity disorder, unspecified type: Secondary | ICD-10-CM | POA: Diagnosis not present

## 2019-09-24 DIAGNOSIS — M1A9XX0 Chronic gout, unspecified, without tophus (tophi): Secondary | ICD-10-CM | POA: Diagnosis not present

## 2019-09-24 DIAGNOSIS — M19041 Primary osteoarthritis, right hand: Secondary | ICD-10-CM | POA: Insufficient documentation

## 2019-09-24 DIAGNOSIS — Z20822 Contact with and (suspected) exposure to covid-19: Secondary | ICD-10-CM | POA: Diagnosis not present

## 2019-09-24 DIAGNOSIS — I10 Essential (primary) hypertension: Secondary | ICD-10-CM | POA: Diagnosis not present

## 2019-09-24 DIAGNOSIS — M19042 Primary osteoarthritis, left hand: Secondary | ICD-10-CM | POA: Insufficient documentation

## 2019-09-24 DIAGNOSIS — M109 Gout, unspecified: Secondary | ICD-10-CM | POA: Diagnosis not present

## 2019-09-24 DIAGNOSIS — Z8249 Family history of ischemic heart disease and other diseases of the circulatory system: Secondary | ICD-10-CM | POA: Diagnosis not present

## 2019-09-24 DIAGNOSIS — M1A9XX1 Chronic gout, unspecified, with tophus (tophi): Secondary | ICD-10-CM | POA: Insufficient documentation

## 2019-09-24 DIAGNOSIS — Z79899 Other long term (current) drug therapy: Secondary | ICD-10-CM | POA: Insufficient documentation

## 2019-09-24 DIAGNOSIS — E785 Hyperlipidemia, unspecified: Secondary | ICD-10-CM | POA: Insufficient documentation

## 2019-09-24 DIAGNOSIS — M009 Pyogenic arthritis, unspecified: Secondary | ICD-10-CM | POA: Diagnosis not present

## 2019-09-24 HISTORY — PX: I & D EXTREMITY: SHX5045

## 2019-09-24 LAB — SARS CORONAVIRUS 2 BY RT PCR (HOSPITAL ORDER, PERFORMED IN ~~LOC~~ HOSPITAL LAB): SARS Coronavirus 2: NEGATIVE

## 2019-09-24 SURGERY — IRRIGATION AND DEBRIDEMENT EXTREMITY
Anesthesia: Monitor Anesthesia Care | Site: Hand | Laterality: Left

## 2019-09-24 MED ORDER — CEFAZOLIN SODIUM-DEXTROSE 1-4 GM/50ML-% IV SOLN
INTRAVENOUS | Status: DC | PRN
  Administered 2019-09-24: 2 g via INTRAVENOUS

## 2019-09-24 MED ORDER — PROPOFOL 500 MG/50ML IV EMUL
INTRAVENOUS | Status: DC | PRN
  Administered 2019-09-24: 50 ug/kg/min via INTRAVENOUS

## 2019-09-24 MED ORDER — LIDOCAINE HCL (PF) 1 % IJ SOLN
INTRAMUSCULAR | Status: AC
  Filled 2019-09-24: qty 5

## 2019-09-24 MED ORDER — MIDAZOLAM HCL 2 MG/2ML IJ SOLN
INTRAMUSCULAR | Status: AC
  Filled 2019-09-24: qty 2

## 2019-09-24 MED ORDER — BUPIVACAINE HCL (PF) 0.25 % IJ SOLN
INTRAMUSCULAR | Status: DC | PRN
  Administered 2019-09-24: 10 mL

## 2019-09-24 MED ORDER — LACTATED RINGERS IV SOLN: INTRAVENOUS | Status: DC

## 2019-09-24 MED ORDER — HYDROMORPHONE HCL 1 MG/ML IJ SOLN: 0.2500 mg | INTRAMUSCULAR | Status: DC | PRN

## 2019-09-24 MED ORDER — ACETAMINOPHEN 500 MG PO TABS
ORAL_TABLET | ORAL | Status: AC
  Filled 2019-09-24: qty 2

## 2019-09-24 MED ORDER — FENTANYL CITRATE (PF) 100 MCG/2ML IJ SOLN
75.0000 ug | Freq: Once | INTRAMUSCULAR | Status: AC
  Administered 2019-09-24: 75 ug via INTRAVENOUS

## 2019-09-24 MED ORDER — OXYCODONE HCL 5 MG PO TABS: 5.0000 mg | ORAL_TABLET | Freq: Once | ORAL | Status: DC | PRN

## 2019-09-24 MED ORDER — ACETAMINOPHEN 500 MG PO TABS
1000.0000 mg | ORAL_TABLET | Freq: Once | ORAL | Status: AC
  Administered 2019-09-24: 1000 mg via ORAL

## 2019-09-24 MED ORDER — LIDOCAINE-EPINEPHRINE (PF) 1.5 %-1:200000 IJ SOLN
INTRAMUSCULAR | Status: DC | PRN
Start: 2019-09-24 — End: 2019-09-24
  Administered 2019-09-24: 10 mL

## 2019-09-24 MED ORDER — MIDAZOLAM HCL 5 MG/5ML IJ SOLN
INTRAMUSCULAR | Status: DC | PRN
  Administered 2019-09-24: 2 mg via INTRAVENOUS

## 2019-09-24 MED ORDER — OXYCODONE HCL 5 MG/5ML PO SOLN: 5.0000 mg | Freq: Once | ORAL | Status: DC | PRN

## 2019-09-24 MED ORDER — ONDANSETRON HCL 4 MG/2ML IJ SOLN
INTRAMUSCULAR | Status: DC | PRN
  Administered 2019-09-24: 4 mg via INTRAVENOUS

## 2019-09-24 MED ORDER — BUPIVACAINE HCL (PF) 0.25 % IJ SOLN
INTRAMUSCULAR | Status: AC
  Filled 2019-09-24: qty 30

## 2019-09-24 MED ORDER — FENTANYL CITRATE (PF) 100 MCG/2ML IJ SOLN
INTRAMUSCULAR | Status: AC
  Filled 2019-09-24: qty 2

## 2019-09-24 MED ORDER — PROMETHAZINE HCL 25 MG/ML IJ SOLN: 6.2500 mg | INTRAMUSCULAR | Status: DC | PRN

## 2019-09-24 MED ORDER — HYDROCODONE-ACETAMINOPHEN 5-325 MG PO TABS: ORAL_TABLET | ORAL | 0 refills | Status: DC

## 2019-09-24 MED ORDER — FENTANYL CITRATE (PF) 100 MCG/2ML IJ SOLN
INTRAMUSCULAR | Status: DC | PRN
  Administered 2019-09-24 (×2): 50 ug via INTRAVENOUS

## 2019-09-24 MED ORDER — KETOROLAC TROMETHAMINE 30 MG/ML IJ SOLN: 30.0000 mg | Freq: Once | INTRAMUSCULAR | Status: DC | PRN

## 2019-09-24 MED FILL — HYDROCODON-APAP 5-325: 5-325 | 3 days supply | Qty: 20 | Fill #0

## 2019-09-24 SURGICAL SUPPLY — 51 items
APL PRP STRL LF DISP 70% ISPRP (MISCELLANEOUS) ×1
BAG DECANTER FOR FLEXI CONT (MISCELLANEOUS) IMPLANT
BLADE MINI RND TIP GREEN BEAV (BLADE) IMPLANT
BLADE SURG 15 STRL LF DISP TIS (BLADE) ×2 IMPLANT
BLADE SURG 15 STRL SS (BLADE) ×6
BNDG CMPR 9X4 STRL LF SNTH (GAUZE/BANDAGES/DRESSINGS) ×1
BNDG COHESIVE 1X5 TAN STRL LF (GAUZE/BANDAGES/DRESSINGS) ×3 IMPLANT
BNDG ELASTIC 2X5.8 VLCR STR LF (GAUZE/BANDAGES/DRESSINGS) IMPLANT
BNDG ELASTIC 3X5.8 VLCR STR LF (GAUZE/BANDAGES/DRESSINGS) IMPLANT
BNDG ESMARK 4X9 LF (GAUZE/BANDAGES/DRESSINGS) ×3 IMPLANT
BNDG GAUZE 1X2.1 STRL (MISCELLANEOUS) IMPLANT
BNDG GAUZE ELAST 4 BULKY (GAUZE/BANDAGES/DRESSINGS) IMPLANT
BRUSH SCRUB EZ PLAIN DRY (MISCELLANEOUS) IMPLANT
CHLORAPREP W/TINT 26 (MISCELLANEOUS) ×3 IMPLANT
CORD BIPOLAR FORCEPS 12FT (ELECTRODE) ×3 IMPLANT
COVER BACK TABLE 60X90IN (DRAPES) ×3 IMPLANT
COVER MAYO STAND STRL (DRAPES) ×3 IMPLANT
COVER WAND RF STERILE (DRAPES) IMPLANT
CUFF TOURN SGL QUICK 18X4 (TOURNIQUET CUFF) ×3 IMPLANT
DRAPE EXTREMITY T 121X128X90 (DISPOSABLE) ×3 IMPLANT
DRAPE SURG 17X23 STRL (DRAPES) ×3 IMPLANT
GAUZE PACKING IODOFORM 1/4X15 (PACKING) ×3 IMPLANT
GAUZE SPONGE 4X4 12PLY STRL (GAUZE/BANDAGES/DRESSINGS) ×3 IMPLANT
GAUZE XEROFORM 1X8 LF (GAUZE/BANDAGES/DRESSINGS) ×3 IMPLANT
GLOVE BIO SURGEON STRL SZ7.5 (GLOVE) ×3 IMPLANT
GLOVE BIOGEL PI IND STRL 8 (GLOVE) ×1 IMPLANT
GLOVE BIOGEL PI INDICATOR 8 (GLOVE) ×2
GOWN STRL REUS W/ TWL LRG LVL3 (GOWN DISPOSABLE) ×1 IMPLANT
GOWN STRL REUS W/TWL LRG LVL3 (GOWN DISPOSABLE) ×3
GOWN STRL REUS W/TWL XL LVL3 (GOWN DISPOSABLE) ×3 IMPLANT
LOOP VESSEL MAXI BLUE (MISCELLANEOUS) IMPLANT
NEEDLE HYPO 25X1 1.5 SAFETY (NEEDLE) ×3 IMPLANT
NS IRRIG 1000ML POUR BTL (IV SOLUTION) ×3 IMPLANT
PACK BASIN DAY SURGERY FS (CUSTOM PROCEDURE TRAY) ×3 IMPLANT
PAD CAST 3X4 CTTN HI CHSV (CAST SUPPLIES) IMPLANT
PADDING CAST ABS 4INX4YD NS (CAST SUPPLIES) ×2
PADDING CAST ABS COTTON 4X4 ST (CAST SUPPLIES) ×1 IMPLANT
PADDING CAST COTTON 3X4 STRL (CAST SUPPLIES)
SPLINT PLASTER CAST XFAST 3X15 (CAST SUPPLIES) IMPLANT
SPLINT PLASTER XTRA FASTSET 3X (CAST SUPPLIES)
STOCKINETTE 4X48 STRL (DRAPES) ×3 IMPLANT
SUT ETHILON 4 0 PS 2 18 (SUTURE) ×3 IMPLANT
SWAB COLLECTION DEVICE MRSA (MISCELLANEOUS) ×3 IMPLANT
SWAB CULTURE ESWAB REG 1ML (MISCELLANEOUS) ×3 IMPLANT
SYR BULB EAR ULCER 3OZ GRN STR (SYRINGE) ×3 IMPLANT
SYR CONTROL 10ML LL (SYRINGE) ×3 IMPLANT
SYR TOOMEY 50ML (SYRINGE) IMPLANT
TOWEL GREEN STERILE FF (TOWEL DISPOSABLE) ×6 IMPLANT
TRAY DSU PREP LF (CUSTOM PROCEDURE TRAY) ×3 IMPLANT
TUBE FEEDING ENTERAL 5FR 16IN (TUBING) IMPLANT
UNDERPAD 30X36 HEAVY ABSORB (UNDERPADS AND DIAPERS) ×3 IMPLANT

## 2019-09-24 NOTE — Anesthesia Procedure Notes (Addendum)
Anesthesia Regional Block: Digital block   Pre-Anesthetic Checklist: ,, timeout performed, Correct Patient, Correct Site, Correct Laterality, Correct Procedure, Correct Position, site marked, Risks and benefits discussed,  Surgical consent,  Pre-op evaluation,  At surgeon's request and post-op pain management  Laterality: Left  Prep: chloraprep       Needles:  Injection technique: Single-shot      Needle Gauge: 25     Additional Needles:   Narrative:  Start time: 09/24/2019 12:34 PM End time: 09/24/2019 12:38 PM Injection made incrementally with aspirations every 2 mL.  Performed by: Personally  Anesthesiologist: Myrtie Soman, MD  Additional Notes: Patient tolerated the procedure well without complications

## 2019-09-24 NOTE — Op Note (Signed)
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   ASSISTANT: none   ANESTHESIA:  Local with sedation   INTRAVENOUS FLUIDS:  Per anesthesia flow sheet.   ESTIMATED BLOOD LOSS:  Minimal.   COMPLICATIONS:  None.   SPECIMENS:   Cultures to micro; gouty tophus to pathology   TOURNIQUET TIME:    Total Tourniquet Time Documented: Upper Arm (Left) - 27 minutes Total: Upper Arm (Left) - 27 minutes    DISPOSITION:  Stable to PACU.   INDICATIONS: 59 year old female with 1 week swelling erythema and pain of left index finger DIP joint.  She started antibiotics a couple days ago.  She wishes to proceed with operative incision and drainage for possible infected DIP joint possible gout. Risks, benefits and alternatives of surgery were discussed including the risks of blood loss, infection, damage to nerves, vessels, tendons, ligaments, bone for surgery, need for additional surgery, complications with wound healing, continued pain, stiffness.  She voiced understanding of these risks and elected to proceed.  OPERATIVE COURSE:  After being identified preoperatively by myself,  the patient and I agreed on the procedure and site of the procedure.  The surgical site was marked.  Surgical consent had been signed. She was given IV antibiotics as preoperative antibiotic prophylaxis. She was transferred to the operating room and placed on the operating table in supine position with the Left upper extremity on an arm board.  Sedation was induced by the anesthesiologist.  A digital  block of been performed by anesthesia in preoperative holding.  Left upper extremity was prepped and draped in normal sterile orthopedic fashion.  A surgical pause was performed between the surgeons, anesthesia, and operating room staff and all were in agreement as to the patient, procedure, and site of procedure.  Tourniquet at the proximal aspect of the extremity was inflated to 250 mmHg after exsanguination of the arm with an Esmarch bandage.    A hockey-stick shaped incision was made at the ulnar side of the DIP joint.  This is carried in subcutaneous tissues by spreading technique.  Bipolar electrocautery was used to obtain hemostasis.  Whitish creamy material was encountered.  This was consistent with gouty tophus.  Did not appear to be purulent.  The material was removed and sent to pathology for examination.  Cultures were taken for aerobes and anaerobes.  The DIP joint was entered underneath the extensor tendon.  There was a large cystic pocket filled with the white material.  This was removed with rongeurs.  The extensor tendon was preserved.  There was a large osteophyte on the dorsum of the distal aspect of the middle phalanx which was taken down as well as large osteophyte at the ulnar side of the distal phalanx which was able to be removed as well.  There was a cystic cavity in the ulnar side of the distal phalanx filled with the white material as well.  This was cleaned out with a curette.  The wound at the ulnar side of the finger from her attempted to drain the finger was also  debrided.  There was whitish material within this.  The wound and joint were copiously irrigated with sterile saline.  The wound was packed with iodoform gauze including underneath the extensor tendon.  A digital block was performed with quarter percent plain Marcaine to aid in postoperative analgesia.  A single nylon suture was placed at the corner of the wound to prevent retraction of this edge.  The wound was dressed with  sterile 4 x 4's and wrapped with a Coban dressing lightly.  An AlumaFoam splint was placed and wrapped lightly with Coban dressing.  The tourniquet was deflated at 27 minutes.  Fingertips were pink with brisk capillary refill after deflation of tourniquet.  The operative  drapes were broken down.  The patient was awoken from anesthesia safely.  She was transferred back to the stretcher and taken to PACU in stable condition.  I will see her back in the office in 3-4 days for postoperative followup.  I will give her a prescription for Norco 5/325 1-2 tabs PO q6 hours prn pain, dispense # 20.  She will continue on doxycycline.  She has colchicine at home which she can start taking for the gout.   [name], MD Electronically signed, 09/24/19

## 2019-09-24 NOTE — Anesthesia Postprocedure Evaluation (Signed)
Anesthesia Post Note  Patient: Penny Rasmussen  Procedure(s) Performed: IRRIGATION AND DEBRIDEMENT OF INDEX FINGER (Left Hand)     Patient location during evaluation: PACU Anesthesia Type: MAC Level of consciousness: awake and alert Pain management: pain level controlled Vital Signs Assessment: post-procedure vital signs reviewed and stable Respiratory status: spontaneous breathing, nonlabored ventilation, respiratory function stable and patient connected to nasal cannula oxygen Cardiovascular status: stable and blood pressure returned to baseline Postop Assessment: no apparent nausea or vomiting Anesthetic complications: no   No complications documented.  Last Vitals:  Vitals:   09/24/19 1415 09/24/19 1430  BP: (!) 147/82 (!) 157/89  Pulse: 71 80  Resp: 17 19  Temp:    SpO2: 96% 97%    Last Pain:  Vitals:   09/24/19 1430  TempSrc:   PainSc: 0-No pain                 Kamron Vanwyhe S

## 2019-09-24 NOTE — Anesthesia Preprocedure Evaluation (Addendum)
Anesthesia Evaluation  Patient identified by MRN, date of birth, ID band Patient awake    Reviewed: Allergy & Precautions, NPO status , Patient's Chart, lab work & pertinent test results  History of Anesthesia Complications Negative for: history of anesthetic complications  Airway Mallampati: II  TM Distance: >3 FB Neck ROM: Full    Dental no notable dental hx.    Pulmonary neg pulmonary ROS,    Pulmonary exam normal breath sounds clear to auscultation       Cardiovascular hypertension, Pt. on medications Normal cardiovascular exam Rhythm:Regular Rate:Normal     Neuro/Psych PSYCHIATRIC DISORDERS negative neurological ROS  negative psych ROS   GI/Hepatic negative GI ROS, Neg liver ROS,   Endo/Other  negative endocrine ROS  Renal/GU negative Renal ROS  negative genitourinary   Musculoskeletal negative musculoskeletal ROS (+) Arthritis , Osteoarthritis,  Left index finger joint infection    Abdominal   Peds negative pediatric ROS (+)  Hematology negative hematology ROS (+)   Anesthesia Other Findings   Reproductive/Obstetrics negative OB ROS                            Anesthesia Physical Anesthesia Plan  ASA: II  Anesthesia Plan: MAC   Post-op Pain Management:    Induction: Intravenous  PONV Risk Score and Plan: 2 and Propofol infusion and Treatment may vary due to age or medical condition  Airway Management Planned: Nasal Cannula  Additional Equipment: None  Intra-op Plan:   Post-operative Plan: Extubation in OR  Informed Consent: I have reviewed the patients History and Physical, chart, labs and discussed the procedure including the risks, benefits and alternatives for the proposed anesthesia with the patient or authorized representative who has indicated his/her understanding and acceptance.     Dental advisory given  Plan Discussed with: CRNA and Surgeon  Anesthesia  Plan Comments:        Anesthesia Quick Evaluation

## 2019-09-24 NOTE — Progress Notes (Signed)
Assisted Dr. Kalman Shan with left digital block. Side rails up, monitors on throughout procedure. See vital signs in flow sheet. Tolerated Procedure well.

## 2019-09-24 NOTE — Discharge Instructions (Addendum)
[facility] Instructions Hand Surgery  Wound Care: Keep your hand elevated above the level of your heart.  Do not allow it to dangle by your side.  Keep the dressing dry and do not remove it unless your doctor advises you to do so.  He will usually change it at the time of your post-op visit.  Moving your fingers is advised to stimulate circulation but will depend on the site of your surgery.  If you have a splint applied, your doctor will advise you regarding movement.  Activity: Do not drive or operate machinery today.  Rest today and then you may return to your normal activity and work as indicated by your physician.  Diet:  Drink liquids today or eat a light diet.  You may resume a regular diet tomorrow.    General expectations: Pain for two to three days. Fingers may become slightly swollen.  Call your doctor if any of the following occur: Severe pain not relieved by pain medication. Elevated temperature. Dressing soaked with blood. Inability to move fingers. White or bluish color to fingers.  **You had 1000 mg of Tylenol at 12:00pm today  Post Anesthesia Home Care Instructions  Activity: Get plenty of rest for the remainder of the day. A responsible individual must stay with you for 24 hours following the procedure.  For the next 24 hours, DO NOT: -Drive a car -Paediatric nurse -Drink alcoholic beverages -Take any medication unless instructed by your physician -Make any legal decisions or sign important papers.  Meals: Start with liquid foods such as gelatin or soup. Progress to regular foods as tolerated. Avoid greasy, spicy, heavy foods. If nausea and/or vomiting occur, drink only clear liquids until the nausea and/or vomiting subsides. Call your physician if vomiting continues.  Special Instructions/Symptoms: Your throat may feel dry or sore from the anesthesia or the breathing tube placed in your throat during surgery. If this causes discomfort, gargle with warm  salt water. The discomfort should disappear within 24 hours.  If you had a scopolamine patch placed behind your ear for the management of post- operative nausea and/or vomiting:  1. The medication in the patch is effective for 72 hours, after which it should be removed.  Wrap patch in a tissue and discard in the trash. Wash hands thoroughly with soap and water. 2. You may remove the patch earlier than 72 hours if you experience unpleasant side effects which may include dry mouth, dizziness or visual disturbances. 3. Avoid touching the patch. Wash your hands with soap and water after contact with the patch.

## 2019-09-24 NOTE — H&P (Signed)
Penny Rasmussen is an 59 y.o. female.   Chief Complaint: left index finger pain HPI: 59 yo female with swelling, erythema, pain left index finger dip joint x 1 week.  Started on antibiotics 2 days ago.  History of gout.  She wishes to proceed with incision and drainage left index finger.  Allergies: No Known Allergies  Past Medical History:  Diagnosis Date   ADHD (attention deficit hyperactivity disorder)    Arthritis    osteoarthritis hands   Complication of anesthesia    Glomerulonephritis    HLD (hyperlipidemia)    Hypertension    Spinal headache    with c-section    Past Surgical History:  Procedure Laterality Date   CESAREAN SECTION     x4   LIPOMA EXCISION Right 10/24/2016   Procedure: EXCISION SOFT TISSUE MASS RIGHT UPPER BACK;  Surgeon: Armandina Gemma, MD;  Location: [facility];  Service: General;  Laterality: Right;    Family History: Family History  Problem Relation Age of Onset   Coronary artery disease Other    Peripheral vascular disease Other        Peripheral arterial disease    Social History:   reports that she has never smoked. She has never used smokeless tobacco. She reports current alcohol use. She reports that she does not use drugs.  Medications: Medications Prior to Admission  Medication Sig Dispense Refill   amLODipine (NORVASC) 5 MG tablet Take 5 mg by mouth daily.     atorvastatin (LIPITOR) 20 MG tablet Take 40 mg by mouth daily.      escitalopram (LEXAPRO) 20 MG tablet Take 20 mg by mouth daily.     HYDROcodone-acetaminophen (NORCO/VICODIN) 5-325 MG tablet Take 1-2 tablets by mouth every 4 (four) hours as needed for moderate pain. 20 tablet 0    Results for orders placed or performed during the hospital encounter of 09/24/19 (from the past 48 hour(s))  SARS Coronavirus 2 by RT PCR (hospital order, performed in [facility] hospital lab) Nasopharyngeal Nasopharyngeal Swab     Status: None   Collection  Time: 09/24/19  8:08 AM   Specimen: Nasopharyngeal Swab  Result Value Ref Range   SARS Coronavirus 2 NEGATIVE NEGATIVE    Comment: (NOTE) SARS-CoV-2 target nucleic acids are NOT DETECTED.  The SARS-CoV-2 RNA is generally detectable in upper and lower respiratory specimens during the acute phase of infection. The lowest concentration of SARS-CoV-2 viral copies this assay can detect is 250 copies / mL. A negative result does not preclude SARS-CoV-2 infection and should not be used as the sole basis for treatment or other patient management decisions.  A negative result may occur with improper specimen collection / handling, submission of specimen other than nasopharyngeal swab, presence of viral mutation(s) within the areas targeted by this assay, and inadequate number of viral copies (<250 copies / mL). A negative result must be combined with clinical observations, patient history, and epidemiological information.  Fact Sheet for Patients:   StrictlyIdeas.no  Fact Sheet for Healthcare Providers: BankingDealers.co.za  This test is not yet approved or  cleared by the [place] FDA and has been authorized for detection and/or diagnosis of SARS-CoV-2 by FDA under an Emergency Use Authorization (EUA).  This EUA will remain in effect (meaning this test can be used) for the duration of the COVID-19 declaration under Section 564(b)(1) of the Act, 21 U.S.C. section 360bbb-3(b)(1), unless the authorization is terminated or revoked sooner.  Performed at [facility],  [address], [place], [place] [address]     No results found.   A comprehensive review of systems was negative.  Blood pressure (!) 150/84, pulse 72, temperature 98.3 F (36.8 C), temperature source Oral, resp. rate 20, height 5\' 2"  (1.575 m), weight 70.3 kg, SpO2 100 %.  General appearance: alert, cooperative and appears stated age Head: Normocephalic, without  obvious abnormality, atraumatic Neck: supple, symmetrical, trachea midline Cardio: regular rate and rhythm Resp: clear to auscultation bilaterally Extremities: Intact sensation and capillary refill all digits.  +epl/fpl/io.  Small wound ulnar side of left index finger with white material within. Pulses: 2+ and symmetric Skin: Skin color, texture, turgor normal. No rashes or lesions Neurologic: Grossly normal Incision/Wound: as above  Assessment/Plan Left index finger dip joint infection vs gout.  Plan incision and drainage left index finger.  Non operative and operative treatment options have been discussed with the patient and patient wishes to proceed with operative treatment. Risks, benefits, and alternatives of surgery have been discussed and the patient agrees with the plan of care.   [name] 09/24/2019, 12:07 PM

## 2019-09-24 NOTE — Transfer of Care (Signed)
Immediate Anesthesia Transfer of Care Note  Patient: Penny Rasmussen  Procedure(s) Performed: IRRIGATION AND DEBRIDEMENT OF INDEX FINGER (Left Hand)  Patient Location: PACU  Anesthesia Type:MAC and Regional  Level of Consciousness: awake, alert  and oriented  Airway & Oxygen Therapy: Patient Spontanous Breathing and Patient connected to face mask oxygen  Post-op Assessment: Report given to RN and Post -op Vital signs reviewed and stable  Post vital signs: Reviewed and stable  Last Vitals:  Vitals Value Taken Time  BP    Temp    Pulse    Resp    SpO2      Last Pain:  Vitals:   09/24/19 1151  TempSrc: Oral  PainSc: 0-No pain      Patients Stated Pain Goal: 3 (95/39/67 2897)  Complications: No complications documented.

## 2019-09-24 NOTE — Progress Notes (Signed)
Patient was covid swabbed at [place] was called at 0810 to pick up sample.  Instructed Courier that it was a RAPID test that needed to be picked up.  Courier did not arrive by 0917.  Called [facility] at [phone] to make them aware the sample had not been picked up.  Courier was contacted again at [phone] for RAPID pickup.  Courier picked up sample at [phone]

## 2019-09-27 ENCOUNTER — Encounter (HOSPITAL_BASED_OUTPATIENT_CLINIC_OR_DEPARTMENT_OTHER): Payer: Self-pay | Admitting: Orthopedic Surgery

## 2019-09-27 DIAGNOSIS — M25442 Effusion, left hand: Secondary | ICD-10-CM | POA: Diagnosis not present

## 2019-09-27 DIAGNOSIS — M10042 Idiopathic gout, left hand: Secondary | ICD-10-CM | POA: Diagnosis not present

## 2019-09-27 DIAGNOSIS — M009 Pyogenic arthritis, unspecified: Secondary | ICD-10-CM | POA: Diagnosis not present

## 2019-09-27 DIAGNOSIS — M79645 Pain in left finger(s): Secondary | ICD-10-CM | POA: Diagnosis not present

## 2019-09-27 LAB — SURGICAL PATHOLOGY

## 2019-09-29 LAB — AEROBIC/ANAEROBIC CULTURE W GRAM STAIN (SURGICAL/DEEP WOUND)
Culture: NO GROWTH
Gram Stain: NONE SEEN

## 2019-10-01 DIAGNOSIS — M10042 Idiopathic gout, left hand: Secondary | ICD-10-CM | POA: Diagnosis not present

## 2019-10-01 DIAGNOSIS — M009 Pyogenic arthritis, unspecified: Secondary | ICD-10-CM | POA: Diagnosis not present

## 2019-10-01 DIAGNOSIS — M79645 Pain in left finger(s): Secondary | ICD-10-CM | POA: Diagnosis not present

## 2019-10-01 DIAGNOSIS — M25442 Effusion, left hand: Secondary | ICD-10-CM | POA: Diagnosis not present

## 2019-10-04 DIAGNOSIS — M25442 Effusion, left hand: Secondary | ICD-10-CM | POA: Diagnosis not present

## 2019-10-04 DIAGNOSIS — M79645 Pain in left finger(s): Secondary | ICD-10-CM | POA: Diagnosis not present

## 2019-10-04 DIAGNOSIS — M009 Pyogenic arthritis, unspecified: Secondary | ICD-10-CM | POA: Diagnosis not present

## 2019-10-04 DIAGNOSIS — M10042 Idiopathic gout, left hand: Secondary | ICD-10-CM | POA: Diagnosis not present

## 2019-10-20 ENCOUNTER — Other Ambulatory Visit (HOSPITAL_COMMUNITY)
Admission: RE | Admit: 2019-10-20 | Discharge: 2019-10-20 | Disposition: A | Discharge status: Home / Self Care | Payer: 59 | Source: Ambulatory Visit | Attending: Orthopedic Surgery | Admitting: Orthopedic Surgery

## 2019-10-20 ENCOUNTER — Other Ambulatory Visit: Payer: Self-pay | Admitting: Orthopedic Surgery

## 2019-10-20 ENCOUNTER — Other Ambulatory Visit: Payer: Self-pay

## 2019-10-20 ENCOUNTER — Encounter (HOSPITAL_BASED_OUTPATIENT_CLINIC_OR_DEPARTMENT_OTHER): Payer: Self-pay | Admitting: Orthopedic Surgery

## 2019-10-20 DIAGNOSIS — Z01812 Encounter for preprocedural laboratory examination: Secondary | ICD-10-CM | POA: Diagnosis not present

## 2019-10-20 DIAGNOSIS — Z20822 Contact with and (suspected) exposure to covid-19: Secondary | ICD-10-CM | POA: Diagnosis not present

## 2019-10-20 DIAGNOSIS — M009 Pyogenic arthritis, unspecified: Secondary | ICD-10-CM | POA: Diagnosis not present

## 2019-10-20 LAB — SARS CORONAVIRUS 2 (TAT 6-24 HRS): SARS Coronavirus 2: NEGATIVE

## 2019-10-20 MED FILL — SULFAMETHOXAZOLE-TMP DS TAB: 800-160 | 7 days supply | Qty: 14 | Fill #0

## 2019-10-21 ENCOUNTER — Encounter (HOSPITAL_BASED_OUTPATIENT_CLINIC_OR_DEPARTMENT_OTHER): Payer: Self-pay | Admitting: Anesthesiology

## 2019-10-21 NOTE — Anesthesia Preprocedure Evaluation (Deleted)
Anesthesia Evaluation    Reviewed: Allergy & Precautions, Patient's Chart, lab work & pertinent test results  History of Anesthesia Complications (+) history of anesthetic complications  Airway        Dental   Pulmonary neg pulmonary ROS,           Cardiovascular hypertension, Pt. on medications      Neuro/Psych  Headaches, PSYCHIATRIC DISORDERS    GI/Hepatic negative GI ROS, Neg liver ROS,   Endo/Other  negative endocrine ROS  Renal/GU Renal disease     Musculoskeletal  (+) Arthritis ,   Abdominal   Peds  Hematology negative hematology ROS (+)   Anesthesia Other Findings   Reproductive/Obstetrics                             Anesthesia Physical Anesthesia Plan  ASA: III  Anesthesia Plan: Bier Block and Bier Block-LIDOCAINE ONLY   Post-op Pain Management:    Induction:   PONV Risk Score and Plan: 3 and Treatment may vary due to age or medical condition, Ondansetron and Midazolam  Airway Management Planned: Natural Airway  Additional Equipment: None  Intra-op Plan:   Post-operative Plan:   Informed Consent:     Dental advisory given  Plan Discussed with:   Anesthesia Plan Comments:         Anesthesia Quick Evaluation

## 2019-10-22 ENCOUNTER — Ambulatory Visit (HOSPITAL_BASED_OUTPATIENT_CLINIC_OR_DEPARTMENT_OTHER): Admission: RE | Admit: 2019-10-22 | Payer: 59 | Source: Home / Self Care | Admitting: Orthopedic Surgery

## 2019-10-22 HISTORY — DX: Local infection of the skin and subcutaneous tissue, unspecified: L08.9

## 2019-10-22 SURGERY — INCISION AND DRAINAGE
Anesthesia: Choice | Laterality: Left

## 2019-10-27 DIAGNOSIS — I129 Hypertensive chronic kidney disease with stage 1 through stage 4 chronic kidney disease, or unspecified chronic kidney disease: Secondary | ICD-10-CM | POA: Diagnosis not present

## 2019-10-27 DIAGNOSIS — N1832 Chronic kidney disease, stage 3b: Secondary | ICD-10-CM | POA: Diagnosis not present

## 2019-10-28 ENCOUNTER — Ambulatory Visit
Admission: RE | Admit: 2019-10-28 | Discharge: 2019-10-28 | Disposition: A | Discharge status: Home / Self Care | Payer: 59 | Source: Ambulatory Visit | Attending: Nephrology | Admitting: Nephrology

## 2019-10-28 ENCOUNTER — Other Ambulatory Visit: Payer: Self-pay | Admitting: Nephrology

## 2019-10-28 DIAGNOSIS — N059 Unspecified nephritic syndrome with unspecified morphologic changes: Secondary | ICD-10-CM

## 2019-10-28 DIAGNOSIS — N183 Chronic kidney disease, stage 3 unspecified: Secondary | ICD-10-CM | POA: Diagnosis not present

## 2019-10-28 DIAGNOSIS — N1832 Chronic kidney disease, stage 3b: Secondary | ICD-10-CM

## 2019-10-29 ENCOUNTER — Other Ambulatory Visit: Payer: Self-pay | Admitting: Nephrology

## 2019-11-01 DIAGNOSIS — Z7689 Persons encountering health services in other specified circumstances: Secondary | ICD-10-CM | POA: Diagnosis not present

## 2019-11-01 DIAGNOSIS — N1832 Chronic kidney disease, stage 3b: Secondary | ICD-10-CM | POA: Diagnosis not present

## 2019-11-01 MED FILL — AMLODIPINE BESYLATE 5 MG TA: 5 | 90 days supply | Qty: 90 | Fill #1

## 2019-11-01 MED FILL — ESCITALOPRAM 20 MG TABLET: 20 | 90 days supply | Qty: 90 | Fill #1

## 2019-11-05 NOTE — Progress Notes (Signed)
Office Visit Note  Patient: Penny Rasmussen             Date of Birth: 25-Feb-1961           MRN: [id number]             PCP: Rexene Agent, MD Referring: Fanny Bien, MD Visit Date: 11/12/2019 Occupation: _0 @  Subjective:  Pain in both hands and feet.   History of Present Illness: Penny Rasmussen is a 59 y.o. female seen in consultation per request of Dr. Joelyn Oms and Dr. Ernie Hew.  According to the patient her symptoms a started about 7 years ago with right toe pain.  At the time she was seen by Dr. Sharol Given and had  x-ray which was normal.  evaluated by Dr. Ernie Hew who made the diagnosis of clinical  gout and placed her on colchicine which was helpful.  She states since then she has been having 2 or 3 episodes of gout every year which are treated with colchicine or anti-inflammatories.  About 6 weeks ago she developed increased pain and swelling in her left index finger which was diagnosed as tophus by Dr. Fredna Dow and he resected the tophus.  Patient is not sure if the crystals were analyzed.  She states the swelling is still persistent he is planning to do another surgery.  She has been experiencing pain and stiffness in her hands and her feet.  She states she has cut back on the red meat and shellfish.  She is to drink on a regular basis until May 2021 but she had an episode of bronchitis since then she has cut back on alcohol intake.  She states she drinks about 4 glasses of red wine over the weekend now.  She was diagnosed with IgA nephropathy at age 78 when she presented with hematuria.  She recalls having a renal biopsy.  She lost follow-up with nephrology.  Recently she went back to see Dr. Joelyn Oms and her creatinine was elevated per patient.  Activities of Daily Living:  Patient reports morning stiffness for 0 minutes.   Patient Denies nocturnal pain.  Difficulty dressing/grooming: Denies Difficulty climbing stairs: Denies Difficulty getting out of chair:  Denies Difficulty using hands for taps, buttons, cutlery, and/or writing: Denies  Review of Systems  Constitutional: Negative for fatigue.  HENT: Negative for mouth sores, mouth dryness and nose dryness.   Eyes: Negative for itching and dryness.  Respiratory: Negative for shortness of breath and difficulty breathing.   Cardiovascular: Negative for chest pain and palpitations.  Gastrointestinal: Negative for blood in stool, constipation and diarrhea.  Endocrine: Negative for increased urination.  Genitourinary: Negative for difficulty urinating.  Musculoskeletal: Positive for arthralgias, joint pain and joint swelling. Negative for myalgias, morning stiffness, muscle tenderness and myalgias.  Skin: Negative for color change, rash and redness.  Allergic/Immunologic: Negative for susceptible to infections.  Neurological: Negative for dizziness, numbness, headaches, memory loss and weakness.  Hematological: Negative for bruising/bleeding tendency.  Psychiatric/Behavioral: Negative for confusion.    PMFS History:  Patient Active Problem List   Diagnosis Date Noted  . Soft tissue mass 10/24/2016  . PALPITATIONS 05/30/2009  . CHEST PAIN 05/30/2009    Past Medical History:  Diagnosis Date  . ADHD (attention deficit hyperactivity disorder)   . Arthritis    osteoarthritis hands  . Complication of anesthesia   . Glomerulonephritis   . HLD (hyperlipidemia)   . Hypertension   . Infected finger    left index finger  .  Spinal headache    with c-section    Family History  Problem Relation Age of Onset  . Coronary artery disease Other   . Peripheral vascular disease Other        Peripheral arterial disease  . Diabetes Mother   . Heart disease Mother   . Gout Brother   . Healthy Son   . Healthy Son   . Healthy Daughter   . Healthy Daughter    Past Surgical History:  Procedure Laterality Date  . ABDOMINAL HYSTERECTOMY    . CESAREAN SECTION     x4  . I & D EXTREMITY Left  09/24/2019   Procedure: IRRIGATION AND DEBRIDEMENT OF INDEX FINGER;  Surgeon: [name], MD;  Location: [facility];  Service: Orthopedics;  Laterality: Left;  . LIPOMA EXCISION Right 10/24/2016   Procedure: EXCISION SOFT TISSUE MASS RIGHT UPPER BACK;  Surgeon: Armandina Gemma, MD;  Location: [facility];  Service: General;  Laterality: Right;   Social History   Social History Narrative   Wife of Penny Rasmussen with vascular surgery. 4 children (3 grown) Active.    Immunization History  Administered Date(s) Administered  . Influenza,inj,Quad PF,6+ Mos 12/08/2018  . PFIZER SARS-COV-2 Vaccination 05/15/2019, 06/08/2019     Objective: Vital Signs: BP 136/86 (BP Location: Right Arm, Patient Position: Sitting, Cuff Size: Normal)   Pulse 66   Resp 13   Ht 5' 2.5" (1.588 m)   Wt 156 lb (70.8 kg)   BMI 28.08 kg/m    Physical Exam Vitals and nursing note reviewed.  Constitutional:      Appearance: She is well-developed.  HENT:     Head: Normocephalic and atraumatic.  Eyes:     Conjunctiva/sclera: Conjunctivae normal.  Cardiovascular:     Rate and Rhythm: Normal rate and regular rhythm.     Heart sounds: Normal heart sounds.  Pulmonary:     Effort: Pulmonary effort is normal.     Breath sounds: Normal breath sounds.  Abdominal:     General: Bowel sounds are normal.     Palpations: Abdomen is soft.  Musculoskeletal:     Cervical back: Normal range of motion.  Lymphadenopathy:     Cervical: No cervical adenopathy.  Skin:    General: Skin is warm and dry.     Capillary Refill: Capillary refill takes less than 2 seconds.  Neurological:     Mental Status: She is alert and oriented to person, place, and time.  Psychiatric:        Behavior: Behavior normal.      Musculoskeletal Exam: C-spine thoracic spine were in good range of motion.  She has limited mobility in the lumbar spine due to hamstring tightness.  Shoulder joints, elbow joints, wrist joints  with good range of motion.  She has DIP and PIP thickening bilaterally.  She has prominence of her left index finger DIP joint with some redness.  Hip joints, knee joints were in good range of motion.  She has bilateral past cavus with callus formation over ball of her feet.  Swelling of right third toe without tenderness was noted which appeared almost like dactylitis.  CDAI Exam: CDAI Score: -- Patient Global: --; Provider Global: -- Swollen: --; Tender: -- Joint Exam 11/12/2019   No joint exam has been documented for this visit   There is currently no information documented on the homunculus. Go to the Rheumatology activity and complete the homunculus joint exam.  Investigation: No additional findings.  Imaging: US RENAL  Result Date: 10/29/2019 CLINICAL DATA:  Initial evaluation for stage III B chronic kidney disease. EXAM: RENAL / URINARY TRACT ULTRASOUND COMPLETE COMPARISON:  None. FINDINGS: Right Kidney: Renal measurements: 8.8 x 5.5 x 6.3 cm = volume: 162 mL. Increased echogenicity within the renal parenchyma with poor corticomedullary differentiation. No nephrolithiasis or hydronephrosis. No focal renal mass. Left Kidney: Renal measurements: 9.0 x 4.3 x 5.6 cm = volume: 111 mL. Increased echogenicity within the renal parenchyma with poor corticomedullary differentiation. No nephrolithiasis or hydronephrosis. No focal renal mass. Bladder: Appears normal for degree of bladder distention. Other: None. IMPRESSION: 1. Increased echogenicity within the renal parenchyma, compatible with medical renal disease. 2. No hydronephrosis or other significant finding. Electronically Signed   By: Jeannine Boga M.D.   On: 10/29/2019 00:18    Recent Labs: No results found for: WBC, HGB, PLT, NA, K, CL, CO2, GLUCOSE, BUN, CREATININE, BILITOT, ALKPHOS, AST, ALT, PROT, ALBUMIN, CALCIUM, GFRAA, QFTBGOLD, QFTBGOLDPLUS  Speciality Comments: No specialty comments available.  Procedures:  No  procedures performed Allergies: Patient has no known allergies.   October 27, 2019 creatinine 2.51 GFR  20, potassium 5.8, UA showed protein creatinine ratio 532 (0-200), uric acid 6.0  August 21, 2019 CBC normal, CMP creatinine 1.72, ALT 46, LDL 126, TSH normal, HIV negative, Covid positive  Assessment / Plan:     Visit Diagnoses: Idiopathic chronic gout of multiple sites with tophus -patient gives history of recurrent gout for the last 7 years.  She has been treated with colchicine and anti-inflammatories.  She states she has was having 3 episodes per year and the last episode was about 2 weeks ago with pain and swelling in her left index finger.  Her uric acid in August was 6.0.  I do not see pathology report showing monosodium urate crystals.  She states she had tophus resection by Dr. [name].  I will try to reach out to him as well.  Detailed counseling gout was provided.  Dietary and pharmacological management were discussed at length.  Indications side effects contraindications of allopurinol and colchicine were discussed.  As she has history of low GFR and statin use we will use low-dose colchicine at 0.3 mg p.o. daily.  I will add allopurinol 100 mg p.o. daily.  We will repeat labs in 1 month.  I will obtain baseline uric acid today while she is not having a flare.  Plan: Uric acid  Pain in both feet -she complains of discomfort in her feet.  Her right third toe was swollen and red without any tenderness which almost appeared like dactylitis.  She has calluses under the balls of her feet.  Plan: XR Foot 2 Views Right, XR Foot 2 Views Left, x-ray of bilateral feet were consistent with osteoarthritis.  Erosive changes were noted in the third and fourth toe DIP joints.  HLA-B27 antigen  Pes cavus-use of arch support and metatarsal arches was discussed.  Pain in both hands -she complains of pain and discomfort in the bilateral hands.  She has PIP and DIP thickening consistent with  osteoarthritis.  She is also has redness and swelling of her left index finger DIP joint.  She states she has tophus resection from there by Dr. [name] recently.  Plan: Sedimentation rate, Uric acid, Rheumatoid factor, HLA-B27 antigen  Medication monitoring encounter - Plan: CBC with Differential/Platelet, COMPLETE METABOLIC PANEL WITH GFR, Hepatitis B core antibody, IgM, Hepatitis B surface antigen, Hepatitis C antibody, QuantiFERON-TB Gold Plus, Serum  protein electrophoresis with reflex, IgG, IgA, IgM  Dyslipidemia-she is on statins.  IgA nephropathy -patient states she was diagnosed at age 79 after renal biopsy and has been under care of Dr. Joelyn Oms.  I reviewed records from Dr. Adin Hector office which showed recent proteinuria and chronic kidney disease stage III reported.  He advised avoiding anti-inflammatories..  Stage 3b chronic kidney disease  Essential hypertension-she is taking amlodipine prescribed by Dr. Joelyn Oms.  She may benefit from losartan.  As it may reduce uric acid levels.  History of depression-she is on medications.  Family history of gout - Brother  Family history of psoriasis-sister.  Orders: Orders Placed This Encounter  Procedures  . XR Foot 2 Views Right  . XR Foot 2 Views Left  . CBC with Differential/Platelet  . COMPLETE METABOLIC PANEL WITH GFR  . Uric acid  . Sedimentation rate  . Uric acid  . Rheumatoid factor  . HLA-B27 antigen  . Hepatitis B core antibody, IgM  . Hepatitis B surface antigen  . Hepatitis C antibody  . QuantiFERON-TB Gold Plus  . Serum protein electrophoresis with reflex  . IgG, IgA, IgM   Meds ordered this encounter  Medications  . colchicine 0.6 MG tablet    Sig: Take 0.5 tablets (0.3 mg total) by mouth daily.    Dispense:  30 tablet    Refill:  2  . allopurinol (ZYLOPRIM) 100 MG tablet    Sig: Take 1 tablet (100 mg total) by mouth daily.    Dispense:  30 tablet    Refill:  1   Nonface-to-face time spent with the  patient was 72 minutes.  Follow-Up Instructions: Return in about 4 weeks (around 12/10/2019) for Gout, osteoarthritis.   Bo Merino, MD  Note - This record has been created using Editor, commissioning.  Chart creation errors have been sought, but may not always  have been located. Such creation errors do not reflect on  the standard of medical care.

## 2019-11-09 DIAGNOSIS — H5203 Hypermetropia, bilateral: Secondary | ICD-10-CM | POA: Diagnosis not present

## 2019-11-12 ENCOUNTER — Encounter: Payer: Self-pay | Admitting: Rheumatology

## 2019-11-12 ENCOUNTER — Other Ambulatory Visit: Payer: Self-pay | Admitting: Rheumatology

## 2019-11-12 ENCOUNTER — Ambulatory Visit: Payer: Self-pay

## 2019-11-12 ENCOUNTER — Other Ambulatory Visit: Payer: Self-pay

## 2019-11-12 ENCOUNTER — Ambulatory Visit (INDEPENDENT_AMBULATORY_CARE_PROVIDER_SITE_OTHER): Payer: 59 | Admitting: Rheumatology

## 2019-11-12 VITALS — BP 136/86 | HR 66 | Resp 13 | Ht 62.5 in | Wt 156.0 lb

## 2019-11-12 DIAGNOSIS — Z8269 Family history of other diseases of the musculoskeletal system and connective tissue: Secondary | ICD-10-CM

## 2019-11-12 DIAGNOSIS — Z840 Family history of diseases of the skin and subcutaneous tissue: Secondary | ICD-10-CM

## 2019-11-12 DIAGNOSIS — Z5181 Encounter for therapeutic drug level monitoring: Secondary | ICD-10-CM | POA: Diagnosis not present

## 2019-11-12 DIAGNOSIS — M79672 Pain in left foot: Secondary | ICD-10-CM | POA: Diagnosis not present

## 2019-11-12 DIAGNOSIS — M1A09X1 Idiopathic chronic gout, multiple sites, with tophus (tophi): Secondary | ICD-10-CM | POA: Diagnosis not present

## 2019-11-12 DIAGNOSIS — M79642 Pain in left hand: Secondary | ICD-10-CM

## 2019-11-12 DIAGNOSIS — E785 Hyperlipidemia, unspecified: Secondary | ICD-10-CM

## 2019-11-12 DIAGNOSIS — N028 Recurrent and persistent hematuria with other morphologic changes: Secondary | ICD-10-CM | POA: Diagnosis not present

## 2019-11-12 DIAGNOSIS — M79671 Pain in right foot: Secondary | ICD-10-CM

## 2019-11-12 DIAGNOSIS — N1832 Chronic kidney disease, stage 3b: Secondary | ICD-10-CM

## 2019-11-12 DIAGNOSIS — Q667 Congenital pes cavus, unspecified foot: Secondary | ICD-10-CM | POA: Diagnosis not present

## 2019-11-12 DIAGNOSIS — M79641 Pain in right hand: Secondary | ICD-10-CM

## 2019-11-12 DIAGNOSIS — I10 Essential (primary) hypertension: Secondary | ICD-10-CM

## 2019-11-12 DIAGNOSIS — Z8659 Personal history of other mental and behavioral disorders: Secondary | ICD-10-CM

## 2019-11-12 MED ORDER — ALLOPURINOL 100 MG PO TABS: 100.0000 mg | ORAL_TABLET | Freq: Every day | ORAL | 1 refills | Status: DC

## 2019-11-12 MED ORDER — COLCHICINE 0.6 MG PO TABS: 0.3000 mg | ORAL_TABLET | Freq: Every day | ORAL | 2 refills | Status: DC

## 2019-11-12 MED FILL — COLCHICINE 0.6 MG TABS: 0.6 | 60 days supply | Qty: 30 | Fill #0

## 2019-11-12 MED FILL — ALLOPURINOL 100 MG TABS: 100 | 30 days supply | Qty: 30 | Fill #0

## 2019-11-12 NOTE — Patient Instructions (Signed)
Return in 4 weeks for your labs.  Start colchicine 0.6 mg, half tablet daily  Start allopurinol 100 mg, 1 tablet  daily (start 2 days after starting colchicine)  Call us if you develop gout flare.  Please do not take any anti-inflammatories.

## 2019-11-15 DIAGNOSIS — M19042 Primary osteoarthritis, left hand: Secondary | ICD-10-CM | POA: Diagnosis not present

## 2019-11-15 DIAGNOSIS — M10042 Idiopathic gout, left hand: Secondary | ICD-10-CM | POA: Diagnosis not present

## 2019-11-15 LAB — PROTEIN ELECTROPHORESIS, SERUM, WITH REFLEX
Albumin ELP: 4.6 g/dL (ref 3.8–4.8)
Alpha 1: 0.3 g/dL (ref 0.2–0.3)
Alpha 2: 0.6 g/dL (ref 0.5–0.9)
Beta 2: 0.4 g/dL (ref 0.2–0.5)
Beta Globulin: 0.6 g/dL (ref 0.4–0.6)
Gamma Globulin: 0.8 g/dL (ref 0.8–1.7)
Total Protein: 7.4 g/dL (ref 6.1–8.1)

## 2019-11-15 LAB — QUANTIFERON-TB GOLD PLUS
Mitogen-NIL: >10 IU/mL
NIL: 0.01 IU/mL
QuantiFERON-TB Gold Plus: NEGATIVE
TB1-NIL: 0 IU/mL
TB2-NIL: 0 IU/mL

## 2019-11-15 LAB — HEPATITIS B CORE ANTIBODY, IGM: Hep B C IgM: NONREACTIVE

## 2019-11-15 LAB — IGG, IGA, IGM
IgG (Immunoglobin G), Serum: 879 mg/dL (ref 600–1540)
IgM, Serum: 58 mg/dL (ref 50–300)
Immunoglobulin A: 260 mg/dL (ref 70–320)

## 2019-11-15 LAB — RHEUMATOID FACTOR: Rheumatoid fact SerPl-aCnc: <14 IU/mL (ref <14)

## 2019-11-15 LAB — HEPATITIS C ANTIBODY
Hepatitis C Ab: NONREACTIVE
SIGNAL TO CUT-OFF: 0.01 (ref <1.00)

## 2019-11-15 LAB — HEPATITIS B SURFACE ANTIGEN: Hepatitis B Surface Ag: NONREACTIVE

## 2019-11-15 LAB — SEDIMENTATION RATE: Sed Rate: 6 mm/h (ref 0–30)

## 2019-11-15 LAB — URIC ACID: Uric Acid, Serum: 5.8 mg/dL (ref 2.5–7.0)

## 2019-11-15 LAB — HLA-B27 ANTIGEN: HLA-B27 Antigen: NEGATIVE

## 2019-11-16 NOTE — Progress Notes (Signed)
I will discuss results at the follow-up visit.

## 2019-11-17 DIAGNOSIS — Z23 Encounter for immunization: Secondary | ICD-10-CM | POA: Diagnosis not present

## 2019-11-28 NOTE — Progress Notes (Signed)
Office Visit Note  Patient: Penny Rasmussen             Date of Birth: 03/05/1960           MRN: [id number]             PCP: Rexene Agent, MD Referring: Rexene Agent, MD Visit Date: 12/10/2019 Occupation: _0 @  Subjective:  Medication management.   History of Present Illness: Penny Rasmussen is a 60 y.o. female with history of gouty arthropathy and osteoarthritis.  She was seen by Dr. Fredna Dow who advised not to do the right first DIP aspiration due to increased risk of infection.  He was quite convinced that she had tophaceous gout based on the previous surgery.  She has been taking colchicine half tablet daily since the last visit.  And allopurinol 100 mg p.o. daily.  She has not had any gout flares.  None of the joints are painful today.  Activities of Daily Living:  Patient reports morning stiffness for 0 minutes.   Patient Denies nocturnal pain.  Difficulty dressing/grooming: Denies Difficulty climbing stairs: Denies Difficulty getting out of chair: Denies Difficulty using hands for taps, buttons, cutlery, and/or writing: Denies  Review of Systems  Constitutional: Negative for fatigue.  HENT: Negative for mouth sores, mouth dryness and nose dryness.   Eyes: Negative for pain, itching and dryness.  Respiratory: Negative for cough, hemoptysis, shortness of breath and difficulty breathing.   Cardiovascular: Negative for chest pain, palpitations and swelling in legs/feet.  Gastrointestinal: Negative for abdominal pain, blood in stool, constipation and diarrhea.  Endocrine: Negative for increased urination.  Genitourinary: Negative for painful urination.  Musculoskeletal: Negative for arthralgias, joint pain, joint swelling, myalgias, muscle weakness, morning stiffness, muscle tenderness and myalgias.  Skin: Negative for color change, rash and redness.  Allergic/Immunologic: Negative for susceptible to infections.  Neurological: Negative for  dizziness, headaches and weakness.  Hematological: Negative for swollen glands.  Psychiatric/Behavioral: Negative for sleep disturbance.    PMFS History:  Patient Active Problem List   Diagnosis Date Noted  . Idiopathic chronic gout of multiple sites with tophus 12/10/2019  . Medication management 12/10/2019  . Pes cavus 12/10/2019  . Primary osteoarthritis of both feet 12/10/2019  . Stage 3b chronic kidney disease ([place]) 12/10/2019  . IgA nephropathy 12/10/2019  . Essential hypertension 12/10/2019  . Dyslipidemia 12/10/2019  . History of depression 12/10/2019  . Soft tissue mass 10/24/2016  . PALPITATIONS 05/30/2009  . CHEST PAIN 05/30/2009    Past Medical History:  Diagnosis Date  . ADHD (attention deficit hyperactivity disorder)   . Arthritis    osteoarthritis hands  . Complication of anesthesia   . Glomerulonephritis   . HLD (hyperlipidemia)   . Hypertension   . Infected finger    left index finger  . Spinal headache    with c-section    Family History  Problem Relation Age of Onset  . Coronary artery disease Other   . Peripheral vascular disease Other        Peripheral arterial disease  . Diabetes Mother   . Heart disease Mother   . Gout Brother   . Healthy Son   . Healthy Son   . Healthy Daughter   . Healthy Daughter   . Kidney cancer Maternal Aunt    Past Surgical History:  Procedure Laterality Date  . ABDOMINAL HYSTERECTOMY    . CESAREAN SECTION     x4  . I & D EXTREMITY Left 09/24/2019  Procedure: IRRIGATION AND DEBRIDEMENT OF INDEX FINGER;  Surgeon: [name], MD;  Location: [facility];  Service: Orthopedics;  Laterality: Left;  . LIPOMA EXCISION Right 10/24/2016   Procedure: EXCISION SOFT TISSUE MASS RIGHT UPPER BACK;  Surgeon: Armandina Gemma, MD;  Location: [facility];  Service: General;  Laterality: Right;   Social History   Social History Narrative   Wife of Penny Rasmussen with vascular surgery. 4 children (3  grown) Active.    Immunization History  Administered Date(s) Administered  . Influenza,inj,Quad PF,6+ Mos 12/08/2018  . PFIZER SARS-COV-2 Vaccination 05/15/2019, 06/08/2019     Objective: Vital Signs: BP 138/78 (BP Location: Left Arm, Patient Position: Sitting, Cuff Size: Small)   Pulse 81   Ht 5' 2.5" (1.588 m)   Wt 158 lb (71.7 kg)   BMI 28.44 kg/m    Physical Exam Vitals and nursing note reviewed.  Constitutional:      Appearance: She is well-developed.  HENT:     Head: Normocephalic and atraumatic.  Eyes:     Conjunctiva/sclera: Conjunctivae normal.  Cardiovascular:     Rate and Rhythm: Normal rate and regular rhythm.     Heart sounds: Normal heart sounds.  Pulmonary:     Effort: Pulmonary effort is normal.     Breath sounds: Normal breath sounds.  Abdominal:     General: Bowel sounds are normal.     Palpations: Abdomen is soft.  Musculoskeletal:     Cervical back: Normal range of motion.  Lymphadenopathy:     Cervical: No cervical adenopathy.  Skin:    General: Skin is warm and dry.     Capillary Refill: Capillary refill takes less than 2 seconds.  Neurological:     Mental Status: She is alert and oriented to person, place, and time.  Psychiatric:        Behavior: Behavior normal.      Musculoskeletal Exam: She had good range of motion of her cervical spine.  Shoulder joints, elbow joints were in good range of motion.  She has DIP thickening with swelling of the left index finger DIP joint.  Hip joints, knee joints, ankles, MTPs and PIPs with good range of motion with no synovitis.  CDAI Exam: CDAI Score: -- Patient Global: --; Provider Global: -- Swollen: --; Tender: -- Joint Exam 12/10/2019   No joint exam has been documented for this visit   There is currently no information documented on the homunculus. Go to the Rheumatology activity and complete the homunculus joint exam.  Investigation: No additional findings.  Imaging: XR Foot 2 Views  Left  Result Date: 11/12/2019 First MTP narrowing was noted.  PIP and DIP narrowing was noted.  No erosive changes were noted.  No intertarsal or tibiotalar joint narrowing was noted. Impression: These findings are consistent with osteoarthritis of the foot.  XR Foot 2 Views Right  Result Date: 11/12/2019 First MTP, PIP and DIP narrowing was noted.  Erosive changes were noted in the third and fourth July DIP joints.  No intertarsal, tibiotalar or subtalar joint space narrowing was noted.  Small calcaneal spur was noted. 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   Recent Labs: Lab Results  Component Value Date   PROT 7.4 11/12/2019   QFTBGOLDPLUS NEGATIVE 11/12/2019   11/12/19 SPEP normal, TB Gold neg, Igs normal, Hep B and Hep C non-reactive, ESR 6, uric acid 5.8, RF-, HLA B-27 negative  Speciality Comments: No  specialty comments available.  Procedures:  No procedures performed Allergies: Patient has no known allergies.   Assessment / Plan:     Visit Diagnoses: Idiopathic chronic gout of multiple sites with tophus - history of recurrent gout for the last 7 years.Possible tophus resected by Dr. Richardo Priest.Not crystal proven.09/24/19-she was started on colchicine and allopurinol at the last visit.  She has been tolerating the medications well.  She has not had any gout flare.  The plan is to increase allopurinol to 200 mg p.o. daily if labs are stable.- Plan: Uric acid  Medication management - allopurinol 100 mg p.o. daily,and low-dose colchicine at 0.3 mg p.o. daily due to statin use. Goal is to reduce uric acid< 4.0. - Plan: CBC with Differential/Platelet, COMPLETE METABOLIC PANEL WITH GFR  Pes cavus -   She has calluses under the balls of her feet.  Proper fitting shoes were discussed.  Primary osteoarthritis of both feet - Questionable dactylitis of right third toe.Erosive changes were noted in the third and  fourth toe DIP joints.  The swelling has subsided.  HLA-B27 was negative.  Stage 3b chronic kidney disease ([facility])-she is followed by Dr. Joelyn Oms.  IgA nephropathy - diagnosed at age 89 after renal biopsy and has been under care of Dr. Joelyn Oms.   Essential hypertension - She is on amlodipine.  She may benefit from losartan.  Dyslipidemia  History of depression  Family history of gout - brother  Family history of psoriasis - sister  Orders: Orders Placed This Encounter  Procedures  . CBC with Differential/Platelet  . COMPLETE METABOLIC PANEL WITH GFR  . Uric acid   No orders of the defined types were placed in this encounter.    Follow-Up Instructions: Return in about 3 months (around 03/11/2020) for Gout.   Bo Merino, MD  Note - This record has been created using Editor, commissioning.  Chart creation errors have been sought, but may not always  have been located. Such creation errors do not reflect on  the standard of medical care.

## 2019-11-29 MED FILL — ROSUVASTATIN CALCIUM 20 MG: 20 | 90 days supply | Qty: 90 | Fill #1

## 2019-11-29 MED FILL — FENOFIBRATE 160 MG TABLET: 160 | 90 days supply | Qty: 90 | Fill #1

## 2019-11-30 DIAGNOSIS — R809 Proteinuria, unspecified: Secondary | ICD-10-CM | POA: Diagnosis not present

## 2019-11-30 DIAGNOSIS — R8281 Pyuria: Secondary | ICD-10-CM | POA: Diagnosis not present

## 2019-11-30 DIAGNOSIS — N1832 Chronic kidney disease, stage 3b: Secondary | ICD-10-CM | POA: Diagnosis not present

## 2019-12-02 MED FILL — LOSARTAN POTASSIUM 25 MG TA: 25 | 90 days supply | Qty: 90 | Fill #0

## 2019-12-03 ENCOUNTER — Other Ambulatory Visit (HOSPITAL_COMMUNITY): Payer: Self-pay | Admitting: Nephrology

## 2019-12-03 DIAGNOSIS — N1832 Chronic kidney disease, stage 3b: Secondary | ICD-10-CM

## 2019-12-03 DIAGNOSIS — R809 Proteinuria, unspecified: Secondary | ICD-10-CM

## 2019-12-10 ENCOUNTER — Ambulatory Visit (INDEPENDENT_AMBULATORY_CARE_PROVIDER_SITE_OTHER): Payer: 59 | Admitting: Rheumatology

## 2019-12-10 ENCOUNTER — Other Ambulatory Visit: Payer: Self-pay

## 2019-12-10 ENCOUNTER — Encounter: Payer: Self-pay | Admitting: Rheumatology

## 2019-12-10 VITALS — BP 138/78 | HR 81 | Ht 62.5 in | Wt 158.0 lb

## 2019-12-10 DIAGNOSIS — Q667 Congenital pes cavus, unspecified foot: Secondary | ICD-10-CM

## 2019-12-10 DIAGNOSIS — Z840 Family history of diseases of the skin and subcutaneous tissue: Secondary | ICD-10-CM

## 2019-12-10 DIAGNOSIS — Z79899 Other long term (current) drug therapy: Secondary | ICD-10-CM | POA: Diagnosis not present

## 2019-12-10 DIAGNOSIS — N02B9 Other recurrent and persistent immunoglobulin A nephropathy: Secondary | ICD-10-CM

## 2019-12-10 DIAGNOSIS — N1832 Chronic kidney disease, stage 3b: Secondary | ICD-10-CM

## 2019-12-10 DIAGNOSIS — I10 Essential (primary) hypertension: Secondary | ICD-10-CM

## 2019-12-10 DIAGNOSIS — Z8659 Personal history of other mental and behavioral disorders: Secondary | ICD-10-CM

## 2019-12-10 DIAGNOSIS — N028 Recurrent and persistent hematuria with other morphologic changes: Secondary | ICD-10-CM | POA: Insufficient documentation

## 2019-12-10 DIAGNOSIS — Z8269 Family history of other diseases of the musculoskeletal system and connective tissue: Secondary | ICD-10-CM

## 2019-12-10 DIAGNOSIS — M1A09X1 Idiopathic chronic gout, multiple sites, with tophus (tophi): Secondary | ICD-10-CM

## 2019-12-10 DIAGNOSIS — M19072 Primary osteoarthritis, left ankle and foot: Secondary | ICD-10-CM

## 2019-12-10 DIAGNOSIS — E785 Hyperlipidemia, unspecified: Secondary | ICD-10-CM

## 2019-12-10 DIAGNOSIS — M19071 Primary osteoarthritis, right ankle and foot: Secondary | ICD-10-CM | POA: Diagnosis not present

## 2019-12-11 LAB — COMPLETE METABOLIC PANEL WITH GFR
AG Ratio: 1.9 (calc) (ref 1.0–2.5)
ALT: 27 U/L (ref 6–29)
AST: 20 U/L (ref 10–35)
Albumin: 4.4 g/dL (ref 3.6–5.1)
Alkaline phosphatase (APISO): 55 U/L (ref 37–153)
BUN/Creatinine Ratio: 18 (calc) (ref 6–22)
BUN: 34 mg/dL — ABNORMAL HIGH (ref 7–25)
CO2: 27 mmol/L (ref 20–32)
Calcium: 10.3 mg/dL (ref 8.6–10.4)
Chloride: 106 mmol/L (ref 98–110)
Creat: 1.88 mg/dL — ABNORMAL HIGH (ref 0.50–0.99)
GFR, Est African American: 33 mL/min/{1.73_m2} — ABNORMAL LOW (ref >=60)
GFR, Est Non African American: 28 mL/min/{1.73_m2} — ABNORMAL LOW (ref >=60)
Globulin: 2.3 g/dL (calc) (ref 1.9–3.7)
Glucose, Bld: 80 mg/dL (ref 65–99)
Potassium: 4.7 mmol/L (ref 3.5–5.3)
Sodium: 143 mmol/L (ref 135–146)
Total Bilirubin: 0.4 mg/dL (ref 0.2–1.2)
Total Protein: 6.7 g/dL (ref 6.1–8.1)

## 2019-12-11 LAB — CBC WITH DIFFERENTIAL/PLATELET
Absolute Monocytes: 320 cells/uL (ref 200–950)
Basophils Absolute: 50 cells/uL (ref 0–200)
Basophils Relative: 1.1 %
Eosinophils Absolute: 72 cells/uL (ref 15–500)
Eosinophils Relative: 1.6 %
HCT: 42.4 % (ref 35.0–45.0)
Hemoglobin: 13.7 g/dL (ref 11.7–15.5)
Lymphs Abs: 1044 cells/uL (ref 850–3900)
MCH: 29.4 pg (ref 27.0–33.0)
MCHC: 32.3 g/dL (ref 32.0–36.0)
MCV: 91 fL (ref 80.0–100.0)
MPV: 11.8 fL (ref 7.5–12.5)
Monocytes Relative: 7.1 %
Neutro Abs: 3015 cells/uL (ref 1500–7800)
Neutrophils Relative %: 67 %
Platelets: 307 10*3/uL (ref 140–400)
RBC: 4.66 10*6/uL (ref 3.80–5.10)
RDW: 13.9 % (ref 11.0–15.0)
Total Lymphocyte: 23.2 %
WBC: 4.5 10*3/uL (ref 3.8–10.8)

## 2019-12-11 LAB — URIC ACID: Uric Acid, Serum: 6.4 mg/dL (ref 2.5–7.0)

## 2019-12-11 NOTE — Progress Notes (Signed)
CBC is normal, comprehensive metabolic panel is normal except GFR is low at 28 and is stable.  Uric acid is 6.4.  The goal is to reach uric acid below 4.0.  Please advise Penny Rasmussen to increase allopurinol 100 mg tablet, 2 tablets p.o. daily.  Repeat CBC, CMP with GFR and uric acid in 1 month

## 2019-12-13 ENCOUNTER — Other Ambulatory Visit: Payer: Self-pay | Admitting: *Deleted

## 2019-12-13 ENCOUNTER — Other Ambulatory Visit: Payer: Self-pay | Admitting: Rheumatology

## 2019-12-13 DIAGNOSIS — Z79899 Other long term (current) drug therapy: Secondary | ICD-10-CM

## 2019-12-13 DIAGNOSIS — M1A09X1 Idiopathic chronic gout, multiple sites, with tophus (tophi): Secondary | ICD-10-CM

## 2019-12-13 MED ORDER — ALLOPURINOL 100 MG PO TABS: 200.0000 mg | ORAL_TABLET | Freq: Every day | ORAL | 1 refills | Status: DC

## 2019-12-13 MED FILL — ALLOPURINOL 100 MG TABS: 100 | 30 days supply | Qty: 60 | Fill #0

## 2019-12-13 NOTE — Telephone Encounter (Signed)
-----   Message from Bo Merino, MD sent at 12/11/2019  6:59 PM EDT ----- CBC is normal, comprehensive metabolic panel is normal except GFR is low at 28 and is stable.  Uric acid is 6.4.  The goal is to reach uric acid below 4.0.  Please advise Penny Rasmussen to increase allopurinol 100 mg tablet, 2 tablets p.o. daily.  Repea t CBC, CMP with GFR and uric acid in 1 month

## 2020-01-06 ENCOUNTER — Other Ambulatory Visit: Payer: Self-pay | Admitting: Radiology

## 2020-01-10 ENCOUNTER — Ambulatory Visit (HOSPITAL_COMMUNITY)
Admission: RE | Admit: 2020-01-10 | Discharge: 2020-01-10 | Disposition: A | Discharge status: Home / Self Care | Payer: 59 | Source: Ambulatory Visit | Attending: Nephrology | Admitting: Nephrology

## 2020-01-10 ENCOUNTER — Other Ambulatory Visit: Payer: Self-pay

## 2020-01-10 ENCOUNTER — Encounter (HOSPITAL_COMMUNITY): Payer: Self-pay

## 2020-01-10 DIAGNOSIS — N1832 Chronic kidney disease, stage 3b: Secondary | ICD-10-CM | POA: Insufficient documentation

## 2020-01-10 DIAGNOSIS — R809 Proteinuria, unspecified: Secondary | ICD-10-CM | POA: Insufficient documentation

## 2020-01-10 DIAGNOSIS — R944 Abnormal results of kidney function studies: Secondary | ICD-10-CM | POA: Diagnosis not present

## 2020-01-10 DIAGNOSIS — N183 Chronic kidney disease, stage 3 unspecified: Secondary | ICD-10-CM | POA: Diagnosis not present

## 2020-01-10 LAB — PROTIME-INR
INR: 0.9 (ref 0.8–1.2)
Prothrombin Time: 12 seconds (ref 11.4–15.2)

## 2020-01-10 LAB — CBC
HCT: 43.7 % (ref 36.0–46.0)
Hemoglobin: 14 g/dL (ref 12.0–15.0)
MCH: 29.1 pg (ref 26.0–34.0)
MCHC: 32 g/dL (ref 30.0–36.0)
MCV: 90.9 fL (ref 80.0–100.0)
Platelets: 310 10*3/uL (ref 150–400)
RBC: 4.81 MIL/uL (ref 3.87–5.11)
RDW: 14.3 % (ref 11.5–15.5)
WBC: 5.2 10*3/uL (ref 4.0–10.5)
nRBC: 0 % (ref 0.0–0.2)

## 2020-01-10 MED ORDER — LIDOCAINE HCL (PF) 1 % IJ SOLN
INTRAMUSCULAR | Status: AC
  Filled 2020-01-10: qty 30

## 2020-01-10 MED ORDER — FENTANYL CITRATE (PF) 100 MCG/2ML IJ SOLN
INTRAMUSCULAR | Status: AC
  Filled 2020-01-10: qty 2

## 2020-01-10 MED ORDER — MIDAZOLAM HCL 2 MG/2ML IJ SOLN
INTRAMUSCULAR | Status: AC | PRN
  Administered 2020-01-10: 1 mg via INTRAVENOUS
  Administered 2020-01-10 (×2): 0.5 mg via INTRAVENOUS

## 2020-01-10 MED ORDER — GELATIN ABSORBABLE 12-7 MM EX MISC
CUTANEOUS | Status: AC
  Filled 2020-01-10: qty 1

## 2020-01-10 MED ORDER — SODIUM CHLORIDE 0.9 % IV SOLN
INTRAVENOUS | Status: AC | PRN
  Administered 2020-01-10: 10 mL/h via INTRAVENOUS

## 2020-01-10 MED ORDER — FENTANYL CITRATE (PF) 100 MCG/2ML IJ SOLN
INTRAMUSCULAR | Status: AC | PRN
Start: 2020-01-10 — End: 2020-01-10
  Administered 2020-01-10: 25 ug via INTRAVENOUS
  Administered 2020-01-10: 50 ug via INTRAVENOUS
  Administered 2020-01-10: 25 ug via INTRAVENOUS

## 2020-01-10 MED ORDER — MIDAZOLAM HCL 2 MG/2ML IJ SOLN
INTRAMUSCULAR | Status: AC
  Filled 2020-01-10: qty 2

## 2020-01-10 MED ORDER — SODIUM CHLORIDE 0.9 % IV SOLN: INTRAVENOUS | Status: DC

## 2020-01-10 NOTE — H&P (Signed)
Chief Complaint: Patient was seen in consultation today for an image guided random renal biopsy.  Referring Physician(s): Bystrom B  Supervising Physician: Aletta Edouard  Patient Status: [facility] - Out-pt  History of Present Illness: Penny Rasmussen is a 59 y.o. female with a past medical history significant for ADHD, arthritis, gout, HTN, HLD and CKD IIIb who presents today for a random renal biopsy with moderate sedation. Penny Rasmussen reports a history of CKD beginning about 20 years ago, she underwent a kidney biopsy with Dr. Jimmy Footman previously which noted glomerulonephritis. She has more recently been followed by Dr. Joelyn Oms and was noted to have worsening serum creatinine as well as 2+ proteinuria - because of these findings she has been referred to IR for an image guided random renal biopsy to help further direct care.  Penny Rasmussen is the wife of vascular surgeon Dr. Deitra Mayo. Her daughter is expecting her third grandson in February of this year which she is very excited about. She denies any complaints today, has been feeling well overall. She's happy that she won't have to stay overnight like she did with her last kidney biopsy. She states understanding of the procedure today and is agreeable to proceed as planned.  Past Medical History:  Diagnosis Date  . ADHD (attention deficit hyperactivity disorder)   . Arthritis    osteoarthritis hands  . Complication of anesthesia   . Glomerulonephritis   . HLD (hyperlipidemia)   . Hypertension   . Infected finger    left index finger  . Spinal headache    with c-section    Past Surgical History:  Procedure Laterality Date  . ABDOMINAL HYSTERECTOMY    . CESAREAN SECTION     x4  . I & D EXTREMITY Left 09/24/2019   Procedure: IRRIGATION AND DEBRIDEMENT OF INDEX FINGER;  Surgeon: [name], MD;  Location: [place];  Service: Orthopedics;  Laterality: Left;  . LIPOMA EXCISION Right  10/24/2016   Procedure: EXCISION SOFT TISSUE MASS RIGHT UPPER BACK;  Surgeon: Armandina Gemma, MD;  Location: [place];  Service: General;  Laterality: Right;    Allergies: Patient has no known allergies.  Medications: Prior to Admission medications   Medication Sig Start Date End Date Taking? Authorizing Provider  allopurinol (ZYLOPRIM) 100 MG tablet Take 2 tablets (200 mg total) by mouth daily. Patient taking differently: Take 200 mg by mouth at bedtime.  12/13/19  Yes Deveshwar, Abel Presto, MD  colchicine 0.6 MG tablet Take 0.5 tablets (0.3 mg total) by mouth daily. Patient taking differently: Take 0.3 mg by mouth every evening.  11/12/19  Yes Deveshwar, Abel Presto, MD  escitalopram (LEXAPRO) 20 MG tablet Take 20 mg by mouth every evening.    Yes [provider]  fenofibrate 160 MG tablet Take 160 mg by mouth every evening.  08/24/19  Yes [provider]  losartan (COZAAR) 25 MG tablet Take 25 mg by mouth every evening.    Yes [provider]     Family History  Problem Relation Age of Onset  . Coronary artery disease Other   . Peripheral vascular disease Other        Peripheral arterial disease  . Diabetes Mother   . Heart disease Mother   . Gout Brother   . Healthy Son   . Healthy Son   . Healthy Daughter   . Healthy Daughter   . Kidney cancer Maternal Aunt     Social History   Socioeconomic History  .  Marital status: Married    Spouse name: Not on file  . Number of children: Not on file  . Years of education: Not on file  . Highest education level: Not on file  Occupational History  . Not on file  Tobacco Use  . Smoking status: Never Smoker  . Smokeless tobacco: Never Used  Vaping Use  . Vaping Use: Never used  Substance and Sexual Activity  . Alcohol use: Yes    Comment: social  . Drug use: No  . Sexual activity: Not on file  Other Topics Concern  . Not on file  Social History Narrative   Wife of Sun Kihn with vascular  surgery. 4 children (3 grown) Active.    Social Determinants of Health   Financial Resource Strain:   . Difficulty of Paying Living Expenses: Not on file  Food Insecurity:   . Worried About Charity fundraiser in the Last Year: Not on file  . Ran Out of Food in the Last Year: Not on file  Transportation Needs:   . Lack of Transportation (Medical): Not on file  . Lack of Transportation (Non-Medical): Not on file  Physical Activity:   . Days of Exercise per Week: Not on file  . Minutes of Exercise per Session: Not on file  Stress:   . Feeling of Stress : Not on file  Social Connections:   . Frequency of Communication with Friends and Family: Not on file  . Frequency of Social Gatherings with Friends and Family: Not on file  . Attends Religious Services: Not on file  . Active Member of Clubs or Organizations: Not on file  . Attends Archivist Meetings: Not on file  . Marital Status: Not on file     Review of Systems: A 12 point ROS discussed and pertinent positives are indicated in the HPI above.  All other systems are negative.  Review of Systems  Constitutional: Negative for appetite change, chills and fever.  Respiratory: Negative for cough and shortness of breath.   Cardiovascular: Negative for chest pain.  Gastrointestinal: Negative for abdominal pain, diarrhea, nausea and vomiting.  Genitourinary: Negative for dysuria, flank pain and hematuria.  Musculoskeletal: Negative for back pain.  Skin: Negative for color change.  Neurological: Negative for dizziness and headaches.    Vital Signs: BP (!) 145/100   Pulse 73   Temp 97.8 F (36.6 C) (Oral)   Resp 16   Ht 5' 2.5" (1.588 m)   Wt 155 lb (70.3 kg)   SpO2 97%   BMI 27.90 kg/m   Physical Exam Vitals reviewed.  Constitutional:      General: She is not in acute distress.    Comments: Very pleasant, talkative, good historian.  HENT:     Head: Normocephalic.     Mouth/Throat:     Mouth: Mucous  membranes are moist.     Pharynx: Oropharynx is clear. No oropharyngeal exudate or posterior oropharyngeal erythema.  Cardiovascular:     Rate and Rhythm: Normal rate and regular rhythm.  Pulmonary:     Effort: Pulmonary effort is normal.  Abdominal:     General: There is no distension.     Palpations: Abdomen is soft.     Tenderness: There is no abdominal tenderness.  Skin:    General: Skin is warm and dry.  Neurological:     Mental Status: She is alert and oriented to person, place, and time.  Psychiatric:  Mood and Affect: Mood normal.        Behavior: Behavior normal.        Thought Content: Thought content normal.        Judgment: Judgment normal.      MD Evaluation Airway: WNL Heart: WNL Abdomen: WNL Chest/ Lungs: WNL ASA  Classification: 2 Mallampati/Airway Score: One   Imaging: No results found.  Labs:  CBC: Recent Labs    12/10/19 1234 01/10/20 0625  WBC 4.5 5.2  HGB 13.7 14.0  HCT 42.4 43.7  PLT 307 310    COAGS: Recent Labs    01/10/20 0625  INR 0.9    BMP: Recent Labs    12/10/19 1234  NA 143  K 4.7  CL 106  CO2 27  GLUCOSE 80  BUN 34*  CALCIUM 10.3  CREATININE 1.88*  GFRNONAA 28*  GFRAA 33*    LIVER FUNCTION TESTS: Recent Labs    11/12/19 1432 12/10/19 1234  BILITOT  --  0.4  AST  --  20  ALT  --  27  PROT 7.4 6.7    TUMOR MARKERS: No results for input(s): AFPTM, CEA, CA199, CHROMGRNA in the last 8760 hours.  Assessment and Plan:  59 y/o F with history of CKD IIIb noted to have worsening serum creatinine and 2+ proteinuria at most recent nephrology visit who presents today for a random renal biopsy to further evaluate these findings.  Patient has been NPO since 6 pm yesterday, no current anticoagulation/antiplatelet medications. Afebrile, WBC 5.2, hgb 14.0, plt 310, INR 0.9.  Risks and benefits of random renal biopsy was discussed with the patient and/or patient's family including, but not limited to  bleeding, infection, damage to adjacent structures or low yield requiring additional tests.  All of the questions were answered and there is agreement to proceed.  Consent signed and in chart.  Thank you for this interesting consult.  I greatly enjoyed meeting Penny Rasmussen and look forward to participating in their care.  A copy of this report was sent to the requesting provider on this date.  Electronically Signed: Joaquim Nam, PA-C 01/10/2020, 7:47 AM   I spent a total of 15 Minutes   in face to face in clinical consultation, greater than 50% of which was counseling/coordinating care for random renal biopsy.

## 2020-01-10 NOTE — Procedures (Signed)
Interventional Radiology Procedure Note  Procedure: US Guided Biopsy of left kidney  Complications: None  Estimated Blood Loss: < 10 mL  Findings: 16 G core biopsy of left lower pole renal cortex performed under US guidance.  Two core samples obtained and sent to Pathology.  Romelo Sciandra T. Derisha Funderburke, M.D Pager:  [phone]   

## 2020-01-10 NOTE — Discharge Instructions (Addendum)
Percutaneous Kidney Biopsy, Care After This sheet gives you information about how to care for yourself after your procedure. Your health care provider may also give you more specific instructions. If you have problems or questions, contact your health care provider. What can I expect after the procedure? After the procedure, it is common to have:  Pain or soreness near the biopsy site.  Pink or cloudy urine for 24 hours after the procedure. Follow these instructions at home: Activity  Return to your normal activities as told by your health care provider. Ask your health care provider what activities are safe for you.  If you were given a sedative during the procedure, it can affect you for several hours. Do not drive or operate machinery until your health care provider says that it is safe.  Do not lift anything that is heavier than 10 lb (4.5 kg), or the limit that you are told, until your health care provider says that it is safe.  Avoid activities that take a lot of effort (are strenuous) until your health care provider approves. Most people will have to wait 2 weeks before returning to activities such as exercise or sex. General instructions   Take over-the-counter and prescription medicines only as told by your health care provider.  You may eat and drink after your procedure. Follow instructions from your health care provider about eating or drinking restrictions.  You may remove your bandaid in 24 hours. Then you may shower.  Check your biopsy site every day for signs of infection. Check for: ? More redness, swelling, or pain. ? Fluid or blood. ? Warmth. ? Pus or a bad smell.  Keep all follow-up visits as told by your health care provider. This is important. Contact a health care provider if:  You have more redness, swelling, or pain around your biopsy site.  You have fluid or blood coming from your biopsy site.  Your biopsy site feels warm to the touch.  You have pus  or a bad smell coming from your biopsy site.  You have blood in your urine more than 24 hours after your procedure.  You have a fever. Get help right away if:  Your urine is dark red or brown.  You cannot urinate.  It burns when you urinate.  You feel dizzy or light-headed.  You have severe pain in your abdomen or side. Summary  After the procedure, it is common to have pain or soreness at the biopsy site and pink or cloudy urine for the first 24 hours.  Check your biopsy site each day for signs of infection, such as more redness, swelling, or pain; fluid, blood, pus or a bad smell coming from the biopsy site; or the biopsy site feeling warm to touch.  Return to your normal activities as told by your health care provider. This information is not intended to replace advice given to you by your health care provider. Make sure you discuss any questions you have with your health care provider. Document Revised: 10/22/2018 Document Reviewed: 10/22/2018 Elsevier Patient Education  2020 Elsevier Inc.  Moderate Conscious Sedation, Adult, Care After These instructions provide you with information about caring for yourself after your procedure. Your health care provider may also give you more specific instructions. Your treatment has been planned according to current medical practices, but problems sometimes occur. Call your health care provider if you have any problems or questions after your procedure. What can I expect after the procedure? After your procedure, it   is common:  To feel sleepy for several hours.  To feel clumsy and have poor balance for several hours.  To have poor judgment for several hours.  To vomit if you eat too soon. Follow these instructions at home: For at least 24 hours after the procedure:   Do not: ? Participate in activities where you could fall or become injured. ? Drive. ? Use heavy machinery. ? Drink alcohol. ? Take sleeping pills or medicines  that cause drowsiness. ? Make important decisions or sign legal documents. ? Take care of children on your own.  Rest. Eating and drinking  Follow the diet recommended by your health care provider.  If you vomit: ? Drink water, juice, or soup when you can drink without vomiting. ? Make sure you have little or no nausea before eating solid foods. General instructions  Have a responsible adult stay with you until you are awake and alert.  Take over-the-counter and prescription medicines only as told by your health care provider.  If you smoke, do not smoke without supervision.  Keep all follow-up visits as told by your health care provider. This is important. Contact a health care provider if:  You keep feeling nauseous or you keep vomiting.  You feel light-headed.  You develop a rash.  You have a fever. Get help right away if:  You have trouble breathing. This information is not intended to replace advice given to you by your health care provider. Make sure you discuss any questions you have with your health care provider. Document Revised: 01/31/2017 Document Reviewed: 06/10/2015 Elsevier Patient Education  2020 Elsevier Inc.  

## 2020-01-25 DIAGNOSIS — N1832 Chronic kidney disease, stage 3b: Secondary | ICD-10-CM | POA: Diagnosis not present

## 2020-01-25 DIAGNOSIS — Z23 Encounter for immunization: Secondary | ICD-10-CM | POA: Diagnosis not present

## 2020-01-26 ENCOUNTER — Other Ambulatory Visit (HOSPITAL_COMMUNITY): Payer: Self-pay | Admitting: Nephrology

## 2020-01-26 DIAGNOSIS — N028 Recurrent and persistent hematuria with other morphologic changes: Secondary | ICD-10-CM | POA: Diagnosis not present

## 2020-01-26 DIAGNOSIS — R809 Proteinuria, unspecified: Secondary | ICD-10-CM | POA: Diagnosis not present

## 2020-01-26 DIAGNOSIS — M1000 Idiopathic gout, unspecified site: Secondary | ICD-10-CM | POA: Diagnosis not present

## 2020-01-26 DIAGNOSIS — N1832 Chronic kidney disease, stage 3b: Secondary | ICD-10-CM | POA: Diagnosis not present

## 2020-01-26 DIAGNOSIS — I129 Hypertensive chronic kidney disease with stage 1 through stage 4 chronic kidney disease, or unspecified chronic kidney disease: Secondary | ICD-10-CM | POA: Diagnosis not present

## 2020-01-26 MED FILL — ALLOPURINOL 100 MG TABS: 100 | 90 days supply | Qty: 180 | Fill #0

## 2020-02-07 MED FILL — ESCITALOPRAM 20 MG TABLET: 20 | 90 days supply | Qty: 90 | Fill #2

## 2020-02-08 ENCOUNTER — Encounter (HOSPITAL_COMMUNITY): Payer: Self-pay | Admitting: Nephrology

## 2020-02-08 LAB — SURGICAL PATHOLOGY

## 2020-02-29 MED FILL — LOSARTAN POTASSIUM 25 MG TA: 25 | 30 days supply | Qty: 30 | Fill #1

## 2020-03-06 NOTE — Progress Notes (Deleted)
Office Visit Note  Patient: Penny Rasmussen             Date of Birth: Mar 31, 1960           MRN: [id number]             PCP: Arita Miss, MD Referring: Arita Miss, MD Visit Date: 03/20/2020 Occupation: @GUAROCC @  Subjective:  No chief complaint on file.   History of Present Illness: Penny Rasmussen is a 60 y.o. female ***   Activities of Daily Living:  Patient reports morning stiffness for *** {minute/hour:19697}.   Patient {ACTIONS;DENIES/REPORTS:21021675::"Denies"} nocturnal pain.  Difficulty dressing/grooming: {ACTIONS;DENIES/REPORTS:21021675::"Denies"} Difficulty climbing stairs: {ACTIONS;DENIES/REPORTS:21021675::"Denies"} Difficulty getting out of chair: {ACTIONS;DENIES/REPORTS:21021675::"Denies"} Difficulty using hands for taps, buttons, cutlery, and/or writing: {ACTIONS;DENIES/REPORTS:21021675::"Denies"}  No Rheumatology ROS completed.   PMFS History:  Patient Active Problem List   Diagnosis Date Noted  . Idiopathic chronic gout of multiple sites with tophus 12/10/2019  . Medication management 12/10/2019  . Pes cavus 12/10/2019  . Primary osteoarthritis of both feet 12/10/2019  . Stage 3b chronic kidney disease (HCC) 12/10/2019  . IgA nephropathy 12/10/2019  . Essential hypertension 12/10/2019  . Dyslipidemia 12/10/2019  . History of depression 12/10/2019  . Soft tissue mass 10/24/2016  . PALPITATIONS 05/30/2009  . CHEST PAIN 05/30/2009    Past Medical History:  Diagnosis Date  . ADHD (attention deficit hyperactivity disorder)   . Arthritis    osteoarthritis hands  . Complication of anesthesia   . Glomerulonephritis   . HLD (hyperlipidemia)   . Hypertension   . Infected finger    left index finger  . Spinal headache    with c-section    Family History  Problem Relation Age of Onset  . Coronary artery disease Other   . Peripheral vascular disease Other        Peripheral arterial disease  . Diabetes Mother   . Heart  disease Mother   . Gout Brother   . Healthy Son   . Healthy Son   . Healthy Daughter   . Healthy Daughter   . Kidney cancer Maternal Aunt    Past Surgical History:  Procedure Laterality Date  . ABDOMINAL HYSTERECTOMY    . CESAREAN SECTION     x4  . I & D EXTREMITY Left 09/24/2019   Procedure: IRRIGATION AND DEBRIDEMENT OF INDEX FINGER;  Surgeon: 09/26/2019, MD;  Location: [facility];  Service: Orthopedics;  Laterality: Left;  . LIPOMA EXCISION Right 10/24/2016   Procedure: EXCISION SOFT TISSUE MASS RIGHT UPPER BACK;  Surgeon: 10/26/2016, MD;  Location: [facility];  Service: General;  Laterality: Right;   Social History   Social History Narrative   Wife of Loribeth Katich with vascular surgery. 4 children (3 grown) Active.    Immunization History  Administered Date(s) Administered  . Influenza,inj,Quad PF,6+ Mos 12/08/2018  . PFIZER SARS-COV-2 Vaccination 05/15/2019, 06/08/2019     Objective: Vital Signs: There were no vitals taken for this visit.   Physical Exam   Musculoskeletal Exam: ***  CDAI Exam: CDAI Score: -- Patient Global: --; Provider Global: -- Swollen: --; Tender: -- Joint Exam 03/20/2020   No joint exam has been documented for this visit   There is currently no information documented on the homunculus. Go to the Rheumatology activity and complete the homunculus joint exam.  Investigation: No additional findings.  Imaging: No results found.  Recent Labs: Lab Results  Component Value Date   WBC 5.2 01/10/2020  HGB 14.0 01/10/2020   PLT 310 01/10/2020   NA 143 12/10/2019   K 4.7 12/10/2019   CL 106 12/10/2019   CO2 27 12/10/2019   GLUCOSE 80 12/10/2019   BUN 34 (H) 12/10/2019   CREATININE 1.88 (H) 12/10/2019   BILITOT 0.4 12/10/2019   AST 20 12/10/2019   ALT 27 12/10/2019   PROT 6.7 12/10/2019   CALCIUM 10.3 12/10/2019   GFRAA 33 (L) 12/10/2019   QFTBGOLDPLUS NEGATIVE 11/12/2019    Speciality Comments:  No specialty comments available.  Procedures:  No procedures performed Allergies: Patient has no known allergies.   Assessment / Plan:     Visit Diagnoses: No diagnosis found.  Orders: No orders of the defined types were placed in this encounter.  No orders of the defined types were placed in this encounter.   Face-to-face time spent with patient was *** minutes. Greater than 50% of time was spent in counseling and coordination of care.  Follow-Up Instructions: No follow-ups on file.   Earnestine Mealing, CMA  Note - This record has been created using Editor, commissioning.  Chart creation errors have been sought, but may not always  have been located. Such creation errors do not reflect on  the standard of medical care.

## 2020-03-07 ENCOUNTER — Other Ambulatory Visit: Payer: Self-pay | Admitting: Family Medicine

## 2020-03-07 DIAGNOSIS — Z1231 Encounter for screening mammogram for malignant neoplasm of breast: Secondary | ICD-10-CM

## 2020-03-07 MED FILL — FENOFIBRATE 160 MG TABLET: 160 | 90 days supply | Qty: 90 | Fill #2

## 2020-03-20 ENCOUNTER — Ambulatory Visit: Payer: 59 | Admitting: Rheumatology

## 2020-03-20 DIAGNOSIS — Z79899 Other long term (current) drug therapy: Secondary | ICD-10-CM

## 2020-03-20 DIAGNOSIS — N1832 Chronic kidney disease, stage 3b: Secondary | ICD-10-CM

## 2020-03-20 DIAGNOSIS — M19071 Primary osteoarthritis, right ankle and foot: Secondary | ICD-10-CM

## 2020-03-20 DIAGNOSIS — N028 Recurrent and persistent hematuria with other morphologic changes: Secondary | ICD-10-CM

## 2020-03-20 DIAGNOSIS — Q667 Congenital pes cavus, unspecified foot: Secondary | ICD-10-CM

## 2020-03-20 DIAGNOSIS — E785 Hyperlipidemia, unspecified: Secondary | ICD-10-CM

## 2020-03-20 DIAGNOSIS — Z840 Family history of diseases of the skin and subcutaneous tissue: Secondary | ICD-10-CM

## 2020-03-20 DIAGNOSIS — M1A09X1 Idiopathic chronic gout, multiple sites, with tophus (tophi): Secondary | ICD-10-CM

## 2020-03-20 DIAGNOSIS — Z8659 Personal history of other mental and behavioral disorders: Secondary | ICD-10-CM

## 2020-03-20 DIAGNOSIS — I10 Essential (primary) hypertension: Secondary | ICD-10-CM

## 2020-03-20 DIAGNOSIS — Z8269 Family history of other diseases of the musculoskeletal system and connective tissue: Secondary | ICD-10-CM

## 2020-04-19 ENCOUNTER — Inpatient Hospital Stay: Admission: RE | Admit: 2020-04-19 | Payer: 59 | Source: Ambulatory Visit

## 2020-04-22 ENCOUNTER — Ambulatory Visit
Admission: RE | Admit: 2020-04-22 | Discharge: 2020-04-22 | Disposition: A | Discharge status: Home / Self Care | Payer: 59 | Source: Ambulatory Visit | Attending: Family Medicine | Admitting: Family Medicine

## 2020-04-22 ENCOUNTER — Other Ambulatory Visit: Payer: Self-pay

## 2020-04-22 DIAGNOSIS — Z1231 Encounter for screening mammogram for malignant neoplasm of breast: Secondary | ICD-10-CM | POA: Diagnosis not present

## 2020-04-25 MED FILL — ALLOPURINOL 100 MG TABS: 100 | 90 days supply | Qty: 180 | Fill #1

## 2020-04-25 MED FILL — LOSARTAN POTASSIUM 25 MG TA: 25 | 30 days supply | Qty: 30 | Fill #2

## 2020-04-25 MED FILL — COLCHICINE 0.6 MG TABS: 0.6 | 90 days supply | Qty: 45 | Fill #1

## 2020-04-25 MED FILL — ESCITALOPRAM 20 MG TABLET: 20 | 90 days supply | Qty: 90 | Fill #3

## 2020-05-11 ENCOUNTER — Other Ambulatory Visit (HOSPITAL_COMMUNITY): Payer: Self-pay | Admitting: Dermatology

## 2020-05-11 DIAGNOSIS — L821 Other seborrheic keratosis: Secondary | ICD-10-CM | POA: Diagnosis not present

## 2020-05-11 DIAGNOSIS — L570 Actinic keratosis: Secondary | ICD-10-CM | POA: Diagnosis not present

## 2020-05-11 DIAGNOSIS — L814 Other melanin hyperpigmentation: Secondary | ICD-10-CM | POA: Diagnosis not present

## 2020-05-11 DIAGNOSIS — L820 Inflamed seborrheic keratosis: Secondary | ICD-10-CM | POA: Diagnosis not present

## 2020-05-11 DIAGNOSIS — D1801 Hemangioma of skin and subcutaneous tissue: Secondary | ICD-10-CM | POA: Diagnosis not present

## 2020-05-11 DIAGNOSIS — D225 Melanocytic nevi of trunk: Secondary | ICD-10-CM | POA: Diagnosis not present

## 2020-05-11 MED FILL — FLUOROURACIL 5 % CREA: 5 | 20 days supply | Qty: 40 | Fill #0

## 2020-05-15 ENCOUNTER — Other Ambulatory Visit (HOSPITAL_BASED_OUTPATIENT_CLINIC_OR_DEPARTMENT_OTHER): Payer: Self-pay

## 2020-05-15 MED ORDER — TRETINOIN 0.05 % EX CREA
TOPICAL_CREAM | CUTANEOUS | 3 refills | Status: DC
  Filled 2020-05-15 (×2): qty 20, 30d supply, fill #0

## 2020-05-15 MED ORDER — LOSARTAN POTASSIUM 25 MG PO TABS
25.0000 mg | ORAL_TABLET | Freq: Every day | ORAL | 0 refills | Status: DC
  Filled 2020-05-15: qty 90, 90d supply, fill #0
  Filled 2020-08-07: qty 90, 90d supply, fill #1

## 2020-05-16 ENCOUNTER — Other Ambulatory Visit (HOSPITAL_BASED_OUTPATIENT_CLINIC_OR_DEPARTMENT_OTHER): Payer: Self-pay

## 2020-05-17 ENCOUNTER — Other Ambulatory Visit (HOSPITAL_BASED_OUTPATIENT_CLINIC_OR_DEPARTMENT_OTHER): Payer: Self-pay

## 2020-05-19 ENCOUNTER — Other Ambulatory Visit (HOSPITAL_BASED_OUTPATIENT_CLINIC_OR_DEPARTMENT_OTHER): Payer: Self-pay

## 2020-05-23 ENCOUNTER — Other Ambulatory Visit (HOSPITAL_BASED_OUTPATIENT_CLINIC_OR_DEPARTMENT_OTHER): Payer: Self-pay

## 2020-05-24 ENCOUNTER — Other Ambulatory Visit (HOSPITAL_BASED_OUTPATIENT_CLINIC_OR_DEPARTMENT_OTHER): Payer: Self-pay

## 2020-05-25 ENCOUNTER — Other Ambulatory Visit (HOSPITAL_BASED_OUTPATIENT_CLINIC_OR_DEPARTMENT_OTHER): Payer: Self-pay

## 2020-06-02 ENCOUNTER — Other Ambulatory Visit (HOSPITAL_BASED_OUTPATIENT_CLINIC_OR_DEPARTMENT_OTHER): Payer: Self-pay

## 2020-06-15 ENCOUNTER — Other Ambulatory Visit (HOSPITAL_COMMUNITY): Payer: Self-pay

## 2020-06-15 MED FILL — Fenofibrate Tab 160 MG: ORAL | 90 days supply | Qty: 90 | Fill #0 | Status: CN

## 2020-06-23 ENCOUNTER — Other Ambulatory Visit (HOSPITAL_COMMUNITY): Payer: Self-pay

## 2020-06-27 ENCOUNTER — Other Ambulatory Visit (HOSPITAL_BASED_OUTPATIENT_CLINIC_OR_DEPARTMENT_OTHER): Payer: Self-pay

## 2020-06-27 MED FILL — Fenofibrate Tab 160 MG: ORAL | 90 days supply | Qty: 90 | Fill #0 | Status: AC

## 2020-08-07 ENCOUNTER — Other Ambulatory Visit (HOSPITAL_COMMUNITY): Payer: Self-pay

## 2020-08-07 MED FILL — Allopurinol Tab 100 MG: ORAL | 90 days supply | Qty: 180 | Fill #0 | Status: AC

## 2020-08-08 ENCOUNTER — Other Ambulatory Visit (HOSPITAL_BASED_OUTPATIENT_CLINIC_OR_DEPARTMENT_OTHER): Payer: Self-pay

## 2020-08-08 ENCOUNTER — Other Ambulatory Visit (HOSPITAL_COMMUNITY): Payer: Self-pay

## 2020-08-08 MED ORDER — ESCITALOPRAM OXALATE 20 MG PO TABS
20.0000 mg | ORAL_TABLET | Freq: Every day | ORAL | 0 refills | Status: DC
  Filled 2020-08-08: qty 30, 30d supply, fill #0

## 2020-08-09 ENCOUNTER — Other Ambulatory Visit (HOSPITAL_COMMUNITY): Payer: Self-pay

## 2020-08-11 ENCOUNTER — Other Ambulatory Visit (HOSPITAL_COMMUNITY): Payer: Self-pay

## 2020-08-29 ENCOUNTER — Other Ambulatory Visit (HOSPITAL_COMMUNITY): Payer: Self-pay

## 2020-08-29 MED ORDER — ESCITALOPRAM OXALATE 20 MG PO TABS
20.0000 mg | ORAL_TABLET | Freq: Every day | ORAL | 0 refills | Status: DC
  Filled 2020-08-29: qty 30, 30d supply, fill #0

## 2020-08-31 ENCOUNTER — Other Ambulatory Visit (HOSPITAL_COMMUNITY): Payer: Self-pay

## 2020-09-01 ENCOUNTER — Other Ambulatory Visit (HOSPITAL_COMMUNITY): Payer: Self-pay

## 2020-09-06 ENCOUNTER — Other Ambulatory Visit (HOSPITAL_COMMUNITY): Payer: Self-pay

## 2020-09-07 ENCOUNTER — Other Ambulatory Visit (HOSPITAL_COMMUNITY): Payer: Self-pay

## 2020-10-08 ENCOUNTER — Other Ambulatory Visit (HOSPITAL_COMMUNITY): Payer: Self-pay

## 2020-10-09 ENCOUNTER — Other Ambulatory Visit (HOSPITAL_COMMUNITY): Payer: Self-pay

## 2020-10-09 ENCOUNTER — Other Ambulatory Visit (HOSPITAL_BASED_OUTPATIENT_CLINIC_OR_DEPARTMENT_OTHER): Payer: Self-pay

## 2020-10-09 MED ORDER — ESCITALOPRAM OXALATE 20 MG PO TABS
20.0000 mg | ORAL_TABLET | Freq: Every day | ORAL | 0 refills | Status: DC
  Filled 2020-10-09: qty 30, 30d supply, fill #0

## 2020-10-09 MED FILL — Colchicine Tab 0.6 MG: ORAL | 30 days supply | Qty: 15 | Fill #0 | Status: AC

## 2020-10-10 ENCOUNTER — Other Ambulatory Visit (HOSPITAL_BASED_OUTPATIENT_CLINIC_OR_DEPARTMENT_OTHER): Payer: Self-pay

## 2020-10-10 ENCOUNTER — Other Ambulatory Visit (HOSPITAL_COMMUNITY): Payer: Self-pay

## 2020-10-11 ENCOUNTER — Other Ambulatory Visit (HOSPITAL_BASED_OUTPATIENT_CLINIC_OR_DEPARTMENT_OTHER): Payer: Self-pay

## 2020-10-12 ENCOUNTER — Other Ambulatory Visit (HOSPITAL_COMMUNITY): Payer: Self-pay

## 2020-10-12 MED ORDER — FENOFIBRATE 160 MG PO TABS
160.0000 mg | ORAL_TABLET | Freq: Every day | ORAL | 0 refills | Status: DC
  Filled 2020-10-12: qty 30, 30d supply, fill #0

## 2020-10-20 DIAGNOSIS — Z0000 Encounter for general adult medical examination without abnormal findings: Secondary | ICD-10-CM | POA: Diagnosis not present

## 2020-10-20 DIAGNOSIS — M109 Gout, unspecified: Secondary | ICD-10-CM | POA: Diagnosis not present

## 2020-10-23 ENCOUNTER — Other Ambulatory Visit (HOSPITAL_COMMUNITY): Payer: Self-pay

## 2020-10-23 DIAGNOSIS — Z23 Encounter for immunization: Secondary | ICD-10-CM | POA: Diagnosis not present

## 2020-10-23 DIAGNOSIS — F331 Major depressive disorder, recurrent, moderate: Secondary | ICD-10-CM | POA: Diagnosis not present

## 2020-10-23 DIAGNOSIS — G4700 Insomnia, unspecified: Secondary | ICD-10-CM | POA: Diagnosis not present

## 2020-10-23 DIAGNOSIS — I10 Essential (primary) hypertension: Secondary | ICD-10-CM | POA: Diagnosis not present

## 2020-10-23 DIAGNOSIS — F411 Generalized anxiety disorder: Secondary | ICD-10-CM | POA: Diagnosis not present

## 2020-10-23 DIAGNOSIS — E782 Mixed hyperlipidemia: Secondary | ICD-10-CM | POA: Diagnosis not present

## 2020-10-23 DIAGNOSIS — M109 Gout, unspecified: Secondary | ICD-10-CM | POA: Diagnosis not present

## 2020-10-23 DIAGNOSIS — N289 Disorder of kidney and ureter, unspecified: Secondary | ICD-10-CM | POA: Diagnosis not present

## 2020-10-23 MED ORDER — LOSARTAN POTASSIUM 25 MG PO TABS
25.0000 mg | ORAL_TABLET | Freq: Every day | ORAL | 3 refills | Status: DC
  Filled 2020-10-23: qty 90, 90d supply, fill #0

## 2020-10-23 MED ORDER — COLCHICINE 0.6 MG PO TABS
0.3000 mg | ORAL_TABLET | Freq: Every day | ORAL | 1 refills | Status: DC
  Filled 2020-10-23: qty 90, 36d supply, fill #0
  Filled 2021-08-29: qty 45, 90d supply, fill #0

## 2020-10-23 MED ORDER — ALLOPURINOL 100 MG PO TABS
200.0000 mg | ORAL_TABLET | Freq: Every day | ORAL | 3 refills | Status: DC
  Filled 2020-10-23: qty 180, 90d supply, fill #0

## 2020-10-23 MED ORDER — BELSOMRA 5 MG PO TABS
5.0000 mg | ORAL_TABLET | Freq: Every evening | ORAL | 1 refills | Status: DC | PRN
  Filled 2020-10-23: qty 60, 30d supply, fill #0

## 2020-10-23 MED ORDER — FENOFIBRATE 160 MG PO TABS
160.0000 mg | ORAL_TABLET | Freq: Every day | ORAL | 3 refills | Status: DC
  Filled 2020-10-23: qty 90, 90d supply, fill #0

## 2020-10-23 MED ORDER — ESCITALOPRAM OXALATE 20 MG PO TABS
20.0000 mg | ORAL_TABLET | Freq: Every day | ORAL | 3 refills | Status: DC
  Filled 2020-10-23 – 2020-11-10 (×2): qty 90, 90d supply, fill #0
  Filled 2021-02-06: qty 90, 90d supply, fill #1
  Filled 2021-05-16: qty 90, 90d supply, fill #2
  Filled 2021-08-06: qty 90, 90d supply, fill #3

## 2020-10-23 MED ORDER — AMLODIPINE BESYLATE 5 MG PO TABS
5.0000 mg | ORAL_TABLET | Freq: Every day | ORAL | 3 refills | Status: DC
  Filled 2020-10-23: qty 90, 90d supply, fill #0

## 2020-10-23 MED ORDER — ROSUVASTATIN CALCIUM 5 MG PO TABS
5.0000 mg | ORAL_TABLET | Freq: Every day | ORAL | 3 refills | Status: DC
  Filled 2020-10-23: qty 90, 90d supply, fill #0

## 2020-10-24 ENCOUNTER — Other Ambulatory Visit (HOSPITAL_COMMUNITY): Payer: Self-pay

## 2020-10-24 MED ORDER — BELSOMRA 5 MG PO TABS
5.0000 mg | ORAL_TABLET | Freq: Every evening | ORAL | 2 refills | Status: DC | PRN
  Filled 2020-10-24 – 2020-12-28 (×2): qty 30, 30d supply, fill #0

## 2020-11-10 DIAGNOSIS — H5203 Hypermetropia, bilateral: Secondary | ICD-10-CM | POA: Diagnosis not present

## 2020-11-10 DIAGNOSIS — H524 Presbyopia: Secondary | ICD-10-CM | POA: Diagnosis not present

## 2020-11-11 ENCOUNTER — Other Ambulatory Visit (HOSPITAL_COMMUNITY): Payer: Self-pay

## 2020-11-13 ENCOUNTER — Other Ambulatory Visit (HOSPITAL_COMMUNITY): Payer: Self-pay

## 2020-12-20 DIAGNOSIS — I10 Essential (primary) hypertension: Secondary | ICD-10-CM | POA: Diagnosis not present

## 2020-12-20 DIAGNOSIS — R739 Hyperglycemia, unspecified: Secondary | ICD-10-CM | POA: Diagnosis not present

## 2020-12-26 DIAGNOSIS — Z0000 Encounter for general adult medical examination without abnormal findings: Secondary | ICD-10-CM | POA: Diagnosis not present

## 2020-12-26 DIAGNOSIS — R739 Hyperglycemia, unspecified: Secondary | ICD-10-CM | POA: Diagnosis not present

## 2020-12-26 DIAGNOSIS — Z1211 Encounter for screening for malignant neoplasm of colon: Secondary | ICD-10-CM | POA: Diagnosis not present

## 2020-12-26 DIAGNOSIS — Z23 Encounter for immunization: Secondary | ICD-10-CM | POA: Diagnosis not present

## 2020-12-28 ENCOUNTER — Other Ambulatory Visit (HOSPITAL_COMMUNITY): Payer: Self-pay

## 2020-12-29 ENCOUNTER — Other Ambulatory Visit (HOSPITAL_COMMUNITY): Payer: Self-pay

## 2021-01-02 ENCOUNTER — Other Ambulatory Visit: Payer: Self-pay | Admitting: Family Medicine

## 2021-01-02 DIAGNOSIS — Z1231 Encounter for screening mammogram for malignant neoplasm of breast: Secondary | ICD-10-CM

## 2021-01-17 ENCOUNTER — Other Ambulatory Visit (HOSPITAL_COMMUNITY): Payer: Self-pay

## 2021-01-17 DIAGNOSIS — G4700 Insomnia, unspecified: Secondary | ICD-10-CM | POA: Diagnosis not present

## 2021-01-17 DIAGNOSIS — F411 Generalized anxiety disorder: Secondary | ICD-10-CM | POA: Diagnosis not present

## 2021-01-17 DIAGNOSIS — I10 Essential (primary) hypertension: Secondary | ICD-10-CM | POA: Diagnosis not present

## 2021-01-17 DIAGNOSIS — E782 Mixed hyperlipidemia: Secondary | ICD-10-CM | POA: Diagnosis not present

## 2021-01-17 DIAGNOSIS — F331 Major depressive disorder, recurrent, moderate: Secondary | ICD-10-CM | POA: Diagnosis not present

## 2021-01-17 DIAGNOSIS — Z7185 Encounter for immunization safety counseling: Secondary | ICD-10-CM | POA: Diagnosis not present

## 2021-01-17 MED ORDER — ESCITALOPRAM OXALATE 20 MG PO TABS
20.0000 mg | ORAL_TABLET | Freq: Every day | ORAL | 3 refills | Status: DC
  Filled 2021-01-17 – 2021-12-03 (×2): qty 90, 90d supply, fill #0

## 2021-01-17 MED ORDER — ROSUVASTATIN CALCIUM 5 MG PO TABS
5.0000 mg | ORAL_TABLET | Freq: Every day | ORAL | 3 refills | Status: DC
  Filled 2021-01-17: qty 90, 90d supply, fill #0
  Filled 2021-05-03: qty 90, 90d supply, fill #1
  Filled 2021-08-06: qty 90, 90d supply, fill #2

## 2021-01-17 MED ORDER — ALLOPURINOL 100 MG PO TABS
200.0000 mg | ORAL_TABLET | Freq: Every day | ORAL | 3 refills | Status: DC
  Filled 2021-01-17: qty 180, 90d supply, fill #0
  Filled 2021-08-06: qty 180, 90d supply, fill #1
  Filled 2021-11-29: qty 180, 90d supply, fill #2

## 2021-01-17 MED ORDER — AMLODIPINE BESYLATE 10 MG PO TABS
10.0000 mg | ORAL_TABLET | Freq: Every day | ORAL | 3 refills | Status: DC
  Filled 2021-01-17: qty 90, 90d supply, fill #0

## 2021-01-17 MED ORDER — ZOLPIDEM TARTRATE 5 MG PO TABS
5.0000 mg | ORAL_TABLET | Freq: Every evening | ORAL | 2 refills | Status: DC | PRN
  Filled 2021-01-17: qty 30, 30d supply, fill #0
  Filled 2021-03-08: qty 30, 30d supply, fill #1

## 2021-01-18 ENCOUNTER — Other Ambulatory Visit: Payer: Self-pay | Admitting: Family Medicine

## 2021-01-18 ENCOUNTER — Other Ambulatory Visit (HOSPITAL_COMMUNITY): Payer: Self-pay

## 2021-01-18 DIAGNOSIS — E785 Hyperlipidemia, unspecified: Secondary | ICD-10-CM

## 2021-02-07 ENCOUNTER — Other Ambulatory Visit (HOSPITAL_COMMUNITY): Payer: Self-pay

## 2021-02-12 ENCOUNTER — Encounter: Payer: Self-pay | Admitting: Cardiovascular Disease

## 2021-02-12 ENCOUNTER — Ambulatory Visit (INDEPENDENT_AMBULATORY_CARE_PROVIDER_SITE_OTHER): Payer: 59

## 2021-02-12 ENCOUNTER — Other Ambulatory Visit (HOSPITAL_COMMUNITY): Payer: Self-pay

## 2021-02-12 ENCOUNTER — Ambulatory Visit (INDEPENDENT_AMBULATORY_CARE_PROVIDER_SITE_OTHER): Payer: 59 | Admitting: Cardiovascular Disease

## 2021-02-12 ENCOUNTER — Other Ambulatory Visit: Payer: Self-pay

## 2021-02-12 VITALS — BP 120/86 | HR 98 | Ht 62.0 in | Wt 160.8 lb

## 2021-02-12 DIAGNOSIS — N183 Chronic kidney disease, stage 3 unspecified: Secondary | ICD-10-CM

## 2021-02-12 DIAGNOSIS — R600 Localized edema: Secondary | ICD-10-CM | POA: Diagnosis not present

## 2021-02-12 DIAGNOSIS — R002 Palpitations: Secondary | ICD-10-CM | POA: Diagnosis not present

## 2021-02-12 DIAGNOSIS — I129 Hypertensive chronic kidney disease with stage 1 through stage 4 chronic kidney disease, or unspecified chronic kidney disease: Secondary | ICD-10-CM | POA: Diagnosis not present

## 2021-02-12 MED ORDER — OMRON 3 SERIES BP MONITOR DEVI
0 refills | Status: DC
  Filled 2021-02-12 – 2021-02-28 (×2): qty 1, 1d supply, fill #0

## 2021-02-12 MED ORDER — METOPROLOL SUCCINATE ER 25 MG PO TB24
25.0000 mg | ORAL_TABLET | Freq: Every day | ORAL | 3 refills | Status: DC
  Filled 2021-02-12: qty 90, 90d supply, fill #0
  Filled 2021-05-20: qty 90, 90d supply, fill #1
  Filled 2021-08-06: qty 90, 90d supply, fill #2
  Filled 2021-11-29: qty 90, 90d supply, fill #3

## 2021-02-12 MED ORDER — AMLODIPINE BESYLATE 5 MG PO TABS
5.0000 mg | ORAL_TABLET | Freq: Every day | ORAL | 3 refills | Status: DC
  Filled 2021-02-12: qty 90, 90d supply, fill #0
  Filled 2021-05-20: qty 90, 90d supply, fill #1
  Filled 2021-08-06: qty 90, 90d supply, fill #2
  Filled 2022-01-02: qty 60, 60d supply, fill #0

## 2021-02-12 NOTE — Patient Instructions (Signed)
Medication Instructions:  1) HOLD Amlodipine for 3 days, then RESTART Amlodipine 5mg  once daily. 2) START Metoprolol Succinate 25mg  once daily  *If you need a refill on your cardiac medications before your next appointment, please call your pharmacy*   Lab Work: BMET, Pro BNP and TSH today  If you have labs (blood work) drawn today and your tests are completely normal, you will receive your results only by: [place] (if you have MyChart) OR A paper copy in the mail If you have any lab test that is abnormal or we need to change your treatment, we will call you to review the results.   Testing/Procedures: Your physician has requested that you have an echocardiogram. Echocardiography is a painless test that uses sound waves to create images of your heart. It provides your doctor with information about the size and shape of your heart and how well your heart's chambers and valves are working. This procedure takes approximately one hour. There are no restrictions for this procedure.  Your physician recommends that you wear a monitor for 3 days.   Follow-Up: At [facility], you and your health needs are our priority.  As part of our continuing mission to provide you with exceptional heart care, we have created designated Provider Care Teams.  These Care Teams include your primary Cardiologist (physician) and Advanced Practice Providers (APPs -  Physician Assistants and Nurse Practitioners) who all work together to provide you with the care you need, when you need it.  We recommend signing up for the patient portal called "MyChart".  Sign up information is provided on this After Visit Summary.  MyChart is used to connect with patients for Virtual Visits (Telemedicine).  Patients are able to view lab/test results, encounter notes, upcoming appointments, etc.  Non-urgent messages can be sent to your provider as well.   To learn more about what you can do with MyChart, go to  [email or web address].    Your next appointment:   1 year(s)  The format for your next appointment:   In Person  Provider:   Sherren Mocha, MD    Other Instructions  ZIO XT- Long Term Monitor Instructions  Your physician has requested you wear a ZIO patch monitor for 3 days.  This is a single patch monitor. [employer] supplies one patch monitor per enrollment. Additional stickers are not available. Please do not apply patch if you will be having a Nuclear Stress Test,  Echocardiogram, Cardiac CT, MRI, or Chest Xray during the period you would be wearing the  monitor. The patch cannot be worn during these tests. You cannot remove and re-apply the  ZIO XT patch monitor.  Your ZIO patch monitor will be mailed 3 day USPS to your address on file. It may take 3-5 days  to receive your monitor after you have been enrolled.  Once you have received your monitor, please review the enclosed instructions. Your monitor  has already been registered assigning a specific monitor serial # to you.  Billing and Patient Assistance Program Information  We have supplied [employer] with any of your insurance information on file for billing purposes. [employer] offers a sliding scale Patient Assistance Program for patients that do not have  insurance, or whose insurance does not completely cover the cost of the ZIO monitor.  You must apply for the Patient Assistance Program to qualify for this discounted rate.  To apply, please call [employer] at [phone], select option 4, select option 2, ask to apply for  Patient  Assistance Program. Theodore Demark will ask your household income, and how many people  are in your household. They will quote your out-of-pocket cost based on that information.  [employer] will also be able to set up a 37-month, interest-free payment plan if needed.  Applying the monitor   Shave hair from upper left chest.  Hold abrader disc by orange tab. Rub abrader in 40 strokes over the upper left  chest as  indicated in your monitor instructions.  Clean area with 4 enclosed alcohol pads. Let dry.  Apply patch as indicated in monitor instructions. Patch will be placed under collarbone on left  side of chest with arrow pointing upward.  Rub patch adhesive wings for 2 minutes. Remove white label marked "1". Remove the white  label marked "2". Rub patch adhesive wings for 2 additional minutes.  While looking in a mirror, press and release button in center of patch. A small green light will  flash 3-4 times. This will be your only indicator that the monitor has been turned on.  Do not shower for the first 24 hours. You may shower after the first 24 hours.  Press the button if you feel a symptom. You will hear a small click. Record Date, Time and  Symptom in the Patient Logbook.  When you are ready to remove the patch, follow instructions on the last 2 pages of Patient  Logbook. Stick patch monitor onto the last page of Patient Logbook.  Place Patient Logbook in the blue and white box. Use locking tab on box and tape box closed  securely. The blue and white box has prepaid postage on it. Please place it in the mailbox as  soon as possible. Your physician should have your test results approximately 7 days after the  monitor has been mailed back to [facility].  Call [place] at [phone] if you have questions regarding  your ZIO XT patch monitor. Call them immediately if you see an orange light blinking on your  monitor.  If your monitor falls off in less than 4 days, contact our Monitor department at [phone].  If your monitor becomes loose or falls off after 4 days call [employer] at [phone] for  suggestions on securing your monitor

## 2021-02-12 NOTE — Progress Notes (Unsigned)
[id number] 3 Day ZIO XT applied in office

## 2021-02-12 NOTE — Progress Notes (Signed)
Cardiology Office Note:    Date:  02/12/2021   ID:  Penny Rasmussen, DOB [birth year], MRN [id number]  PCP:  Rexene Agent, MD   [facility] Providers Cardiologist:  None     Referring MD: Rexene Agent, MD   Chief Complaint  Patient presents with   Edema    History of Present Illness:    Penny Rasmussen is a 60 y.o. female with a hx of hypertension and stage IIIb chronic kidney disease, presents for evaluation of lower extremity edema.  The patient was seen here about 10 years ago for heart palpitations and chest pain.  She had a nuclear stress test that showed no ischemia and an event monitor that was unremarkable.  She recently has developed significant swelling in her lower legs and feet.  She has not had orthopnea or PND.  She has had elevated blood pressure readings in her amlodipine was increased about a month ago.  She has also had heart palpitations and her apple watch has shown elevated heart rate readings sometimes in the 120 range or higher.  The patient has not had exertional chest pain or pressure.  She has been concerned about her cardiac condition and has experienced anxiety around this.  Her mother had multivessel bypass surgery at age 31.  Past Medical History:  Diagnosis Date   ADHD (attention deficit hyperactivity disorder)    Arthritis    osteoarthritis hands   Complication of anesthesia    Glomerulonephritis    HLD (hyperlipidemia)    Hypertension    Infected finger    left index finger   Spinal headache    with c-section    Past Surgical History:  Procedure Laterality Date   ABDOMINAL HYSTERECTOMY     CESAREAN SECTION     x4   I & D EXTREMITY Left 09/24/2019   Procedure: IRRIGATION AND DEBRIDEMENT OF INDEX FINGER;  Surgeon: [name], MD;  Location: [place];  Service: Orthopedics;  Laterality: Left;   LIPOMA EXCISION Right 10/24/2016   Procedure: EXCISION SOFT TISSUE MASS RIGHT UPPER BACK;  Surgeon:  Armandina Gemma, MD;  Location: [place];  Service: General;  Laterality: Right;    Current Medications: Current Meds  Medication Sig   allopurinol (ZYLOPRIM) 100 MG tablet Take 2 tablets (200 mg total) by mouth daily.   allopurinol (ZYLOPRIM) 100 MG tablet Take 2 tablets (200 mg total) by mouth daily.   escitalopram (LEXAPRO) 20 MG tablet Take 20 mg by mouth every evening.    escitalopram (LEXAPRO) 20 MG tablet Take 1 tablet (20 mg total) by mouth daily.   escitalopram (LEXAPRO) 20 MG tablet Take 1 tablet (20 mg total) by mouth daily.   fenofibrate 160 MG tablet Take 160 mg by mouth every evening.    fluorouracil (EFUDEX) 5 % cream APPLY 1 APPLICATION ON THE SKIN TWICE A DAY FOR 2 WEEKS   metoprolol succinate (TOPROL XL) 25 MG 24 hr tablet Take 1 tablet (25 mg total) by mouth daily.   rosuvastatin (CRESTOR) 5 MG tablet Take 1 tablet (5 mg total) by mouth at bedtime for high cholesterol.   zolpidem (AMBIEN) 5 MG tablet Take 1 tablet (5 mg total) by mouth at bedtime as needed.   [DISCONTINUED] amLODipine (NORVASC) 10 MG tablet Take 1 tablet (10 mg total) by mouth daily.     Allergies:   Patient has no known allergies.   Social History   Socioeconomic History   Marital status:  Married    Spouse name: Not on file   Number of children: Not on file   Years of education: Not on file   Highest education level: Not on file  Occupational History   Not on file  Tobacco Use   Smoking status: Never   Smokeless tobacco: Never  Vaping Use   Vaping Use: Never used  Substance and Sexual Activity   Alcohol use: Yes    Comment: social   Drug use: No   Sexual activity: Not on file  Other Topics Concern   Not on file  Social History Narrative   Wife of Penny Rasmussen with vascular surgery. 4 children (3 grown) Active.    Social Determinants of Health   Financial Resource Strain: Not on file  Food Insecurity: Not on file  Transportation Needs: Not on file  Physical  Activity: Not on file  Stress: Not on file  Social Connections: Not on file     Family History: The patient's family history includes Coronary artery disease in an other family member; Diabetes in her mother; Gout in her brother; Healthy in her daughter, daughter, son, and son; Heart disease in her mother; Kidney cancer in her maternal aunt; Peripheral vascular disease in an other family member.  ROS:   Please see the history of present illness.    All other systems reviewed and are negative.  EKGs/Labs/Other Studies Reviewed:    EKG:  EKG is ordered today.  The ekg ordered today demonstrates normal sinus rhythm 98 bpm, within normal limits.  Recent Labs: No results found for requested labs within last 8760 hours.  Recent Lipid Panel No results found for: CHOL, TRIG, HDL, CHOLHDL, VLDL, LDLCALC, LDLDIRECT   Risk Assessment/Calculations:           Physical Exam:    VS:  BP 120/86   Pulse 98   Ht 5\' 2"  (1.575 m)   Wt 160 lb 12.8 oz (72.9 kg)   SpO2 94%   BMI 29.41 kg/m     Wt Readings from Last 3 Encounters:  02/12/21 160 lb 12.8 oz (72.9 kg)  01/10/20 155 lb (70.3 kg)  12/10/19 158 lb (71.7 kg)     GEN:  Well nourished, well developed in no acute distress HEENT: Normal NECK: No JVD; No carotid bruits LYMPHATICS: No lymphadenopathy CARDIAC: RRR, no murmurs, rubs, gallops RESPIRATORY:  Clear to auscultation without rales, wheezing or rhonchi  ABDOMEN: Soft, non-tender, non-distended MUSCULOSKELETAL:  No edema; No deformity  SKIN: Warm and dry NEUROLOGIC:  Alert and oriented x 3 PSYCHIATRIC:  Normal affect   ASSESSMENT:    1. Benign hypertension with chronic kidney disease, stage III (HCC)   2. Palpitations   3. Lower extremity edema    PLAN:    In order of problems listed above:  Reviewed the patient's medication history.  I suspect the increase in amlodipine dosing has resulted in her leg swelling.  I recommend that she hold amlodipine for 3 days and  then resume on a lower dose of 5 mg daily.  I will add metoprolol succinate 25 mg daily to her medical regimen.  This may help with her heart palpitations and elevated heart rate as well.  I advised that she obtain an [employer] blood pressure cuff for home readings and check her blood pressure 2-3 times per week.  We will check a metabolic panel today. Check 3-day ZIO monitor.  Start metoprolol succinate. Differential diagnosis includes amlodipine side effect, venous insufficiency, cardiac etiology.  Check 2D echocardiogram.  I think amlodipine side effect is by far the most plausible explanation of her increased edema.  Medication changes and testing as outlined above.  Also recommended that she follow through with a coronary CT calcium score considering her family history of coronary artery disease.  She has been started on a statin drug by her primary physician.           Medication Adjustments/Labs and Tests Ordered: Current medicines are reviewed at length with the patient today.  Concerns regarding medicines are outlined above.  Orders Placed This Encounter  Procedures   Basic metabolic panel   Pro b natriuretic peptide   TSH   LONG TERM MONITOR (3-14 DAYS)   EKG 12-Lead   ECHOCARDIOGRAM COMPLETE    Meds ordered this encounter  Medications   amLODipine (NORVASC) 5 MG tablet    Sig: Take 1 tablet (5 mg total) by mouth daily for hypertension.    Dispense:  90 tablet    Refill:  3    Dose change   metoprolol succinate (TOPROL XL) 25 MG 24 hr tablet    Sig: Take 1 tablet (25 mg total) by mouth daily.    Dispense:  90 tablet    Refill:  3     Patient Instructions  Medication Instructions:  1) HOLD Amlodipine for 3 days, then RESTART Amlodipine 5mg  once daily. 2) START Metoprolol Succinate 25mg  once daily  *If you need a refill on your cardiac medications before your next appointment, please call your pharmacy*   Lab Work: BMET, Pro BNP and TSH today  If you have labs  (blood work) drawn today and your tests are completely normal, you will receive your results only by: [place] (if you have MyChart) OR A paper copy in the mail If you have any lab test that is abnormal or we need to change your treatment, we will call you to review the results.   Testing/Procedures: Your physician has requested that you have an echocardiogram. Echocardiography is a painless test that uses sound waves to create images of your heart. It provides your doctor with information about the size and shape of your heart and how well your heart's chambers and valves are working. This procedure takes approximately one hour. There are no restrictions for this procedure.  Your physician recommends that you wear a monitor for 3 days.   Follow-Up: At [facility], you and your health needs are our priority.  As part of our continuing mission to provide you with exceptional heart care, we have created designated Provider Care Teams.  These Care Teams include your primary Cardiologist (physician) and Advanced Practice Providers (APPs -  Physician Assistants and Nurse Practitioners) who all work together to provide you with the care you need, when you need it.  We recommend signing up for the patient portal called "MyChart".  Sign up information is provided on this After Visit Summary.  MyChart is used to connect with patients for Virtual Visits (Telemedicine).  Patients are able to view lab/test results, encounter notes, upcoming appointments, etc.  Non-urgent messages can be sent to your provider as well.   To learn more about what you can do with MyChart, go to [email or web address].    Your next appointment:   1 year(s)  The format for your next appointment:   In Person  Provider:   Sherren Mocha, MD    Other Instructions  Bryn Gulling- Long Term Monitor Instructions  Your physician has requested you  wear a ZIO patch monitor for 3 days.  This is a single patch monitor.  [employer] supplies one patch monitor per enrollment. Additional stickers are not available. Please do not apply patch if you will be having a Nuclear Stress Test,  Echocardiogram, Cardiac CT, MRI, or Chest Xray during the period you would be wearing the  monitor. The patch cannot be worn during these tests. You cannot remove and re-apply the  ZIO XT patch monitor.  Your ZIO patch monitor will be mailed 3 day USPS to your address on file. It may take 3-5 days  to receive your monitor after you have been enrolled.  Once you have received your monitor, please review the enclosed instructions. Your monitor  has already been registered assigning a specific monitor serial # to you.  Billing and Patient Assistance Program Information  We have supplied [employer] with any of your insurance information on file for billing purposes. [employer] offers a sliding scale Patient Assistance Program for patients that do not have  insurance, or whose insurance does not completely cover the cost of the ZIO monitor.  You must apply for the Patient Assistance Program to qualify for this discounted rate.  To apply, please call [employer] at [phone], select option 4, select option 2, ask to apply for  Patient Assistance Program. Theodore Demark will ask your household income, and how many people  are in your household. They will quote your out-of-pocket cost based on that information.  [employer] will also be able to set up a 40-month, interest-free payment plan if needed.  Applying the monitor   Shave hair from upper left chest.  Hold abrader disc by orange tab. Rub abrader in 40 strokes over the upper left chest as  indicated in your monitor instructions.  Clean area with 4 enclosed alcohol pads. Let dry.  Apply patch as indicated in monitor instructions. Patch will be placed under collarbone on left  side of chest with arrow pointing upward.  Rub patch adhesive wings for 2 minutes. Remove white label marked "1". Remove the  white  label marked "2". Rub patch adhesive wings for 2 additional minutes.  While looking in a mirror, press and release button in center of patch. A small green light will  flash 3-4 times. This will be your only indicator that the monitor has been turned on.  Do not shower for the first 24 hours. You may shower after the first 24 hours.  Press the button if you feel a symptom. You will hear a small click. Record Date, Time and  Symptom in the Patient Logbook.  When you are ready to remove the patch, follow instructions on the last 2 pages of Patient  Logbook. Stick patch monitor onto the last page of Patient Logbook.  Place Patient Logbook in the blue and white box. Use locking tab on box and tape box closed  securely. The blue and white box has prepaid postage on it. Please place it in the mailbox as  soon as possible. Your physician should have your test results approximately 7 days after the  monitor has been mailed back to [facility].  Call [place] at [phone] if you have questions regarding  your ZIO XT patch monitor. Call them immediately if you see an orange light blinking on your  monitor.  If your monitor falls off in less than 4 days, contact our Monitor department at [phone].  If your monitor becomes loose or falls off after 4 days call [employer]  at [phone] for  suggestions on securing your monitor     Signed, Sherren Mocha, MD  02/12/2021 2:37 PM    [place]

## 2021-02-13 LAB — BASIC METABOLIC PANEL
BUN/Creatinine Ratio: 17 (ref 12–28)
BUN: 29 mg/dL — ABNORMAL HIGH (ref 8–27)
CO2: 21 mmol/L (ref 20–29)
Calcium: 10.2 mg/dL (ref 8.7–10.3)
Chloride: 103 mmol/L (ref 96–106)
Creatinine, Ser: 1.69 mg/dL — ABNORMAL HIGH (ref 0.57–1.00)
Glucose: 82 mg/dL (ref 70–99)
Potassium: 4.4 mmol/L (ref 3.5–5.2)
Sodium: 142 mmol/L (ref 134–144)
eGFR: 34 mL/min/{1.73_m2} — ABNORMAL LOW (ref >59)

## 2021-02-13 LAB — PRO B NATRIURETIC PEPTIDE: NT-Pro BNP: 142 pg/mL (ref 0–287)

## 2021-02-13 LAB — TSH: TSH: 2.42 u[IU]/mL (ref 0.450–4.500)

## 2021-02-20 ENCOUNTER — Other Ambulatory Visit (HOSPITAL_COMMUNITY): Payer: Self-pay

## 2021-02-21 DIAGNOSIS — R002 Palpitations: Secondary | ICD-10-CM | POA: Diagnosis not present

## 2021-02-28 ENCOUNTER — Other Ambulatory Visit (HOSPITAL_COMMUNITY): Payer: Self-pay

## 2021-03-01 ENCOUNTER — Other Ambulatory Visit: Payer: Self-pay

## 2021-03-01 ENCOUNTER — Ambulatory Visit (HOSPITAL_COMMUNITY): Payer: 59 | Attending: Cardiovascular Disease

## 2021-03-01 DIAGNOSIS — R002 Palpitations: Secondary | ICD-10-CM | POA: Insufficient documentation

## 2021-03-01 DIAGNOSIS — R600 Localized edema: Secondary | ICD-10-CM | POA: Diagnosis not present

## 2021-03-01 LAB — ECHOCARDIOGRAM COMPLETE
Area-P 1/2: 3.75 cm2
S' Lateral: 2.2 cm

## 2021-03-06 ENCOUNTER — Ambulatory Visit
Admission: RE | Admit: 2021-03-06 | Discharge: 2021-03-06 | Disposition: A | Discharge status: Home / Self Care | Payer: No Typology Code available for payment source | Source: Ambulatory Visit | Attending: Family Medicine | Admitting: Family Medicine

## 2021-03-06 DIAGNOSIS — E785 Hyperlipidemia, unspecified: Secondary | ICD-10-CM

## 2021-03-08 ENCOUNTER — Other Ambulatory Visit (HOSPITAL_COMMUNITY): Payer: Self-pay

## 2021-03-08 ENCOUNTER — Ambulatory Visit: Payer: 59 | Admitting: Internal Medicine

## 2021-03-22 DIAGNOSIS — M109 Gout, unspecified: Secondary | ICD-10-CM | POA: Diagnosis not present

## 2021-03-22 DIAGNOSIS — I10 Essential (primary) hypertension: Secondary | ICD-10-CM | POA: Diagnosis not present

## 2021-03-22 DIAGNOSIS — E782 Mixed hyperlipidemia: Secondary | ICD-10-CM | POA: Diagnosis not present

## 2021-04-23 ENCOUNTER — Ambulatory Visit: Payer: 59

## 2021-05-02 ENCOUNTER — Ambulatory Visit
Admission: RE | Admit: 2021-05-02 | Discharge: 2021-05-02 | Disposition: A | Discharge status: Home / Self Care | Payer: 59 | Source: Ambulatory Visit | Attending: Family Medicine | Admitting: Family Medicine

## 2021-05-02 DIAGNOSIS — Z1231 Encounter for screening mammogram for malignant neoplasm of breast: Secondary | ICD-10-CM | POA: Diagnosis not present

## 2021-05-04 ENCOUNTER — Other Ambulatory Visit (HOSPITAL_COMMUNITY): Payer: Self-pay

## 2021-05-07 ENCOUNTER — Other Ambulatory Visit (HOSPITAL_COMMUNITY): Payer: Self-pay

## 2021-05-07 DIAGNOSIS — E782 Mixed hyperlipidemia: Secondary | ICD-10-CM | POA: Diagnosis not present

## 2021-05-07 DIAGNOSIS — N19 Unspecified kidney failure: Secondary | ICD-10-CM | POA: Diagnosis not present

## 2021-05-07 DIAGNOSIS — M109 Gout, unspecified: Secondary | ICD-10-CM | POA: Diagnosis not present

## 2021-05-07 DIAGNOSIS — G4700 Insomnia, unspecified: Secondary | ICD-10-CM | POA: Diagnosis not present

## 2021-05-07 DIAGNOSIS — M6208 Separation of muscle (nontraumatic), other site: Secondary | ICD-10-CM | POA: Diagnosis not present

## 2021-05-07 DIAGNOSIS — E669 Obesity, unspecified: Secondary | ICD-10-CM | POA: Diagnosis not present

## 2021-05-07 DIAGNOSIS — I10 Essential (primary) hypertension: Secondary | ICD-10-CM | POA: Diagnosis not present

## 2021-05-07 MED ORDER — SAXENDA 18 MG/3ML ~~LOC~~ SOPN
PEN_INJECTOR | SUBCUTANEOUS | 1 refills | Status: AC
  Filled 2021-05-07 – 2021-05-14 (×4): qty 6, 33d supply, fill #0

## 2021-05-08 ENCOUNTER — Other Ambulatory Visit (HOSPITAL_COMMUNITY): Payer: Self-pay

## 2021-05-11 DIAGNOSIS — D224 Melanocytic nevi of scalp and neck: Secondary | ICD-10-CM | POA: Diagnosis not present

## 2021-05-11 DIAGNOSIS — L570 Actinic keratosis: Secondary | ICD-10-CM | POA: Diagnosis not present

## 2021-05-11 DIAGNOSIS — Z85828 Personal history of other malignant neoplasm of skin: Secondary | ICD-10-CM | POA: Diagnosis not present

## 2021-05-11 DIAGNOSIS — L814 Other melanin hyperpigmentation: Secondary | ICD-10-CM | POA: Diagnosis not present

## 2021-05-11 DIAGNOSIS — D1801 Hemangioma of skin and subcutaneous tissue: Secondary | ICD-10-CM | POA: Diagnosis not present

## 2021-05-14 ENCOUNTER — Other Ambulatory Visit (HOSPITAL_COMMUNITY): Payer: Self-pay

## 2021-05-16 ENCOUNTER — Other Ambulatory Visit (HOSPITAL_COMMUNITY): Payer: Self-pay

## 2021-05-17 ENCOUNTER — Other Ambulatory Visit (HOSPITAL_COMMUNITY): Payer: Self-pay

## 2021-05-17 MED ORDER — TRETINOIN 0.05 % EX GEL
1.0000 "application " | Freq: Every evening | CUTANEOUS | 2 refills | Status: DC
  Filled 2021-05-17: qty 45, 30d supply, fill #0

## 2021-05-21 ENCOUNTER — Other Ambulatory Visit (HOSPITAL_COMMUNITY): Payer: Self-pay

## 2021-08-06 ENCOUNTER — Other Ambulatory Visit (HOSPITAL_COMMUNITY): Payer: Self-pay

## 2021-08-14 ENCOUNTER — Other Ambulatory Visit (HOSPITAL_COMMUNITY): Payer: Self-pay

## 2021-08-22 DIAGNOSIS — C4402 Squamous cell carcinoma of skin of lip: Secondary | ICD-10-CM | POA: Diagnosis not present

## 2021-08-29 ENCOUNTER — Other Ambulatory Visit (HOSPITAL_COMMUNITY): Payer: Self-pay

## 2021-09-17 ENCOUNTER — Other Ambulatory Visit (HOSPITAL_COMMUNITY): Payer: Self-pay

## 2021-09-17 DIAGNOSIS — N3000 Acute cystitis without hematuria: Secondary | ICD-10-CM | POA: Diagnosis not present

## 2021-09-17 DIAGNOSIS — N390 Urinary tract infection, site not specified: Secondary | ICD-10-CM | POA: Diagnosis not present

## 2021-09-17 MED ORDER — NITROFURANTOIN MONOHYD MACRO 100 MG PO CAPS
100.0000 mg | ORAL_CAPSULE | Freq: Two times a day (BID) | ORAL | 0 refills | Status: DC
  Filled 2021-09-17: qty 10, 5d supply, fill #0

## 2021-10-03 ENCOUNTER — Other Ambulatory Visit (HOSPITAL_COMMUNITY): Payer: Self-pay

## 2021-10-03 DIAGNOSIS — C4402 Squamous cell carcinoma of skin of lip: Secondary | ICD-10-CM | POA: Diagnosis not present

## 2021-10-03 DIAGNOSIS — Z85828 Personal history of other malignant neoplasm of skin: Secondary | ICD-10-CM | POA: Diagnosis not present

## 2021-10-03 MED ORDER — DOXYCYCLINE HYCLATE 100 MG PO CAPS
100.0000 mg | ORAL_CAPSULE | Freq: Two times a day (BID) | ORAL | 0 refills | Status: DC
  Filled 2021-10-03: qty 10, 5d supply, fill #0

## 2021-11-19 ENCOUNTER — Other Ambulatory Visit (HOSPITAL_COMMUNITY): Payer: Self-pay

## 2021-11-30 ENCOUNTER — Other Ambulatory Visit (HOSPITAL_COMMUNITY): Payer: Self-pay

## 2021-12-03 ENCOUNTER — Other Ambulatory Visit (HOSPITAL_COMMUNITY): Payer: Self-pay

## 2021-12-10 ENCOUNTER — Other Ambulatory Visit (HOSPITAL_COMMUNITY): Payer: Self-pay

## 2021-12-10 DIAGNOSIS — H524 Presbyopia: Secondary | ICD-10-CM | POA: Diagnosis not present

## 2022-01-02 ENCOUNTER — Other Ambulatory Visit (HOSPITAL_COMMUNITY): Payer: Self-pay

## 2022-01-02 MED ORDER — ROSUVASTATIN CALCIUM 5 MG PO TABS
5.0000 mg | ORAL_TABLET | Freq: Every evening | ORAL | 0 refills | Status: DC | PRN
  Filled 2022-01-02: qty 60, 60d supply, fill #0

## 2022-02-06 DIAGNOSIS — I10 Essential (primary) hypertension: Secondary | ICD-10-CM | POA: Diagnosis not present

## 2022-02-06 DIAGNOSIS — Z23 Encounter for immunization: Secondary | ICD-10-CM | POA: Diagnosis not present

## 2022-02-06 DIAGNOSIS — M6208 Separation of muscle (nontraumatic), other site: Secondary | ICD-10-CM | POA: Diagnosis not present

## 2022-02-12 ENCOUNTER — Encounter: Payer: Self-pay | Admitting: Cardiovascular Disease

## 2022-02-12 ENCOUNTER — Ambulatory Visit: Payer: 59 | Attending: Cardiovascular Disease | Admitting: Cardiovascular Disease

## 2022-02-12 VITALS — BP 124/90 | HR 83 | Ht 62.0 in | Wt 160.4 lb

## 2022-02-12 DIAGNOSIS — N183 Chronic kidney disease, stage 3 unspecified: Secondary | ICD-10-CM

## 2022-02-12 DIAGNOSIS — E782 Mixed hyperlipidemia: Secondary | ICD-10-CM | POA: Diagnosis not present

## 2022-02-12 DIAGNOSIS — R0609 Other forms of dyspnea: Secondary | ICD-10-CM | POA: Diagnosis not present

## 2022-02-12 DIAGNOSIS — R931 Abnormal findings on diagnostic imaging of heart and coronary circulation: Secondary | ICD-10-CM | POA: Diagnosis not present

## 2022-02-12 DIAGNOSIS — I129 Hypertensive chronic kidney disease with stage 1 through stage 4 chronic kidney disease, or unspecified chronic kidney disease: Secondary | ICD-10-CM

## 2022-02-12 NOTE — Patient Instructions (Signed)
Medication Instructions:  Your physician recommends that you continue on your current medications as directed. Please refer to the Current Medication list given to you today.  *If you need a refill on your cardiac medications before your next appointment, please call your pharmacy*   Lab Work: Lipids, Liver today/this week If you have labs (blood work) drawn today and your tests are completely normal, you will receive your results only by: [place] (if you have MyChart) OR A paper copy in the mail If you have any lab test that is abnormal or we need to change your treatment, we will call you to review the results.   Testing/Procedures: Exercise Myoview Stress test Your physician has requested that you have en exercise stress myoview. For further information please visit [email or web address]. Please follow instruction sheet, as given.  Follow-Up: At [facility], you and your health needs are our priority.  As part of our continuing mission to provide you with exceptional heart care, we have created designated Provider Care Teams.  These Care Teams include your primary Cardiologist (physician) and Advanced Practice Providers (APPs -  Physician Assistants and Nurse Practitioners) who all work together to provide you with the care you need, when you need it.  We recommend signing up for the patient portal called "MyChart".  Sign up information is provided on this After Visit Summary.  MyChart is used to connect with patients for Virtual Visits (Telemedicine).  Patients are able to view lab/test results, encounter notes, upcoming appointments, etc.  Non-urgent messages can be sent to your provider as well.   To learn more about what you can do with MyChart, go to [email or web address].    Your next appointment:   1 year(s)  The format for your next appointment:   In Person  Provider:   Sherren Mocha, MD or APP     Important Information About Sugar

## 2022-02-12 NOTE — Progress Notes (Unsigned)
Cardiology Office Note:    Date:  02/13/2022   ID:  Penny Rasmussen, DOB April 18, 1960, MRN [id number]  PCP:  Fanny Bien, MD   [facility] Providers Cardiologist:  None     Referring MD: Fanny Bien, MD   Chief Complaint  Patient presents with   Shortness of Breath    History of Present Illness:    Penny Rasmussen is a 61 y.o. female with a hx of hypertension and stage III chronic kidney disease, presenting for follow-up evaluation.  The patient was last seen in December 2022 for an initial visit.  She had a CT coronary calcium score placing her in the 68th percentile.  She had problems with leg swelling that resolved with reducing her amlodipine dose.  She has developed problems with exertional dyspnea and has been concerned about her cardiac risk and this is a potential anginal equivalent since she has a strong family history of CAD.  She denies chest pain or pressure.  She denies orthopnea, PND, or recurrent heart palpitations.  She is short of breath with any moderate level exercise.  Past Medical History:  Diagnosis Date   ADHD (attention deficit hyperactivity disorder)    Arthritis    osteoarthritis hands   Complication of anesthesia    Glomerulonephritis    HLD (hyperlipidemia)    Hypertension    Infected finger    left index finger   Spinal headache    with c-section    Past Surgical History:  Procedure Laterality Date   ABDOMINAL HYSTERECTOMY     CESAREAN SECTION     x4   I & D EXTREMITY Left 09/24/2019   Procedure: IRRIGATION AND DEBRIDEMENT OF INDEX FINGER;  Surgeon: [name], MD;  Location: [facility];  Service: Orthopedics;  Laterality: Left;   LIPOMA EXCISION Right 10/24/2016   Procedure: EXCISION SOFT TISSUE MASS RIGHT UPPER BACK;  Surgeon: Armandina Gemma, MD;  Location: [facility];  Service: General;  Laterality: Right;    Current Medications: Current Meds  Medication Sig    amLODipine (NORVASC) 5 MG tablet Take 1 tablet (5 mg total) by mouth daily for blood pressure   escitalopram (LEXAPRO) 20 MG tablet Take 1 tablet (20 mg total) by mouth daily.   metoprolol succinate (TOPROL XL) 25 MG 24 hr tablet Take 1 tablet (25 mg total) by mouth daily.   rosuvastatin (CRESTOR) 5 MG tablet Take 1 tablet (5 mg total) by mouth at bedtime for high cholesterol   tretinoin (RETIN-A) 0.05 % cream Apply to the affected area of the skin nightly (Patient taking differently: as needed. Apply to the affected area of the skin nightly)   [DISCONTINUED] allopurinol (ZYLOPRIM) 100 MG tablet Take 2 tablets (200 mg total) by mouth daily.   [DISCONTINUED] allopurinol (ZYLOPRIM) 100 MG tablet Take 2 tablets (200 mg total) by mouth daily.   [DISCONTINUED] Blood Pressure Monitoring (OMRON 3 SERIES BP MONITOR) DEVI Use as directed   [DISCONTINUED] colchicine 0.6 MG tablet Take 0.5 tablets (0.3 mg total) by mouth daily. May take 1 tablet by mouth twice daily if needed for a flare up   [DISCONTINUED] doxycycline (VIBRAMYCIN) 100 MG capsule Take 1 capsule (100 mg total) by mouth 2 (two) times daily.   [DISCONTINUED] escitalopram (LEXAPRO) 20 MG tablet Take 20 mg by mouth every evening.    [DISCONTINUED] escitalopram (LEXAPRO) 20 MG tablet Take 1 tablet (20 mg total) by mouth daily.   [DISCONTINUED] fenofibrate 160 MG tablet  Take 160 mg by mouth every evening.    [DISCONTINUED] fenofibrate 160 MG tablet Take 1 tablet (160 mg total) by mouth daily.   [DISCONTINUED] losartan (COZAAR) 25 MG tablet Take 25 mg by mouth every evening.   [DISCONTINUED] losartan (COZAAR) 25 MG tablet Take 1 tablet (25 mg total) by mouth daily at bedtime.   [DISCONTINUED] losartan (COZAAR) 25 MG tablet Take 1 tablet (25 mg total) by mouth at bedtime.   [DISCONTINUED] nitrofurantoin, macrocrystal-monohydrate, (MACROBID) 100 MG capsule Take 1 capsule (100 mg total) by mouth 2 (two) times daily for 5 days   [DISCONTINUED]  rosuvastatin (CRESTOR) 5 MG tablet Take 1 tablet (5 mg total) by mouth at bedtime for high cholesterol.   [DISCONTINUED] rosuvastatin (CRESTOR) 5 MG tablet Take 1 tablet (5 mg total) by mouth at bedtime for high cholesterol.   [DISCONTINUED] Suvorexant (BELSOMRA) 5 MG TABS Take 1 to 2 tablets by mouth once daily at bedtime as needed.   [DISCONTINUED] Suvorexant (BELSOMRA) 5 MG TABS Take 1 tablet by mouth at bedtime as needed.   [DISCONTINUED] zolpidem (AMBIEN) 5 MG tablet Take 1 tablet (5 mg total) by mouth at bedtime as needed.     Allergies:   Patient has no known allergies.   Social History   Socioeconomic History   Marital status: Married    Spouse name: Not on file   Number of children: Not on file   Years of education: Not on file   Highest education level: Not on file  Occupational History   Not on file  Tobacco Use   Smoking status: Never   Smokeless tobacco: Never  Vaping Use   Vaping Use: Never used  Substance and Sexual Activity   Alcohol use: Yes    Comment: social   Drug use: No   Sexual activity: Not on file  Other Topics Concern   Not on file  Social History Narrative   Wife of Emory Leaver with vascular surgery. 4 children (3 grown) Active.    Social Determinants of Health   Financial Resource Strain: Not on file  Food Insecurity: Not on file  Transportation Needs: Not on file  Physical Activity: Not on file  Stress: Not on file  Social Connections: Not on file     Family History: The patient's family history includes Coronary artery disease in an other family member; Diabetes in her mother; Gout in her brother; Healthy in her daughter, daughter, son, and son; Heart disease in her mother; Kidney cancer in her maternal aunt; Peripheral vascular disease in an other family member.  ROS:   Please see the history of present illness.    All other systems reviewed and are negative.  EKGs/Labs/Other Studies Reviewed:    The following studies were reviewed  today: 2D echocardiogram 03/01/2021:  1. Left ventricular ejection fraction, by estimation, is 60 to 65%. The  left ventricle has normal function. The left ventricle has no regional  wall motion abnormalities. Left ventricular diastolic parameters are  consistent with Grade I diastolic  dysfunction (impaired relaxation).   2. Right ventricular systolic function is normal. The right ventricular  size is normal. Tricuspid regurgitation signal is inadequate for assessing  PA pressure.   3. The mitral valve is grossly normal. No evidence of mitral valve  regurgitation. No evidence of mitral stenosis.   4. The aortic valve is tricuspid. There is mild calcification of the  aortic valve. Aortic valve regurgitation is not visualized. No aortic  stenosis is present.  5. The inferior vena cava is normal in size with greater than 50%  respiratory variability, suggesting right atrial pressure of 3 mmHg.   Conclusion(s)/Recommendation(s): Normal biventricular function without  evidence of hemodynamically significant valvular heart disease.   Coronary calcium scan 03/06/2021: 1. Coronary calcium score of 11.7 is at the [place] percentile for the patient's age, sex and race. 2. Mild atherosclerosis of the thoracic aorta. 3. Small hiatal hernia.  EKG:  EKG is ordered today.  The ekg ordered today demonstrates normal sinus rhythm 83 bpm, within normal limits.  Recent Labs: 02/12/2022: ALT 21  Recent Lipid Panel    Component Value Date/Time   CHOL 264 (H) 02/12/2022 1530   TRIG 185 (H) 02/12/2022 1530   HDL 77 02/12/2022 1530   CHOLHDL 3.4 02/12/2022 1530   LDLCALC 154 (H) 02/12/2022 1530     Risk Assessment/Calculations:           Physical Exam:    VS:  BP (!) 124/90   Pulse 83   Ht '5\' 2"'$  (1.575 m)   Wt 160 lb 6.4 oz (72.8 kg)   SpO2 97%   BMI 29.34 kg/m     Wt Readings from Last 3 Encounters:  02/12/22 160 lb 6.4 oz (72.8 kg)  02/12/21 160 lb 12.8 oz (72.9 kg)  01/10/20 155 lb  (70.3 kg)     GEN:  Well nourished, well developed in no acute distress HEENT: Normal NECK: No JVD; No carotid bruits LYMPHATICS: No lymphadenopathy CARDIAC: RRR, no murmurs, rubs, gallops RESPIRATORY:  Clear to auscultation without rales, wheezing or rhonchi  ABDOMEN: Soft, non-tender, non-distended MUSCULOSKELETAL:  No edema; No deformity  SKIN: Warm and dry NEUROLOGIC:  Alert and oriented x 3 PSYCHIATRIC:  Normal affect   ASSESSMENT:    1. Exertional dyspnea   2. Benign hypertension with chronic kidney disease, stage III ([place])   3. Mixed hyperlipidemia   4. Elevated coronary artery calcium score    PLAN:    In order of problems listed above:  Lung exam clear with no pulmonary risk factors.  2D echo essentially normal with the exception of grade 1 diastolic dysfunction.  With risk factors of hypertension, mixed hyperlipidemia, and family history of CAD, favor ischemic assessment with an exercise Myoview stress test. Blood pressure borderline on amlodipine 5 mg and metoprolol succinate 25 mg daily.  States home readings and other office readings have been in good range.  Will continue to monitor at home. Treated with rosuvastatin 5 mg daily.  Will update lipids and LFTs when she returns.  Goal LDL cholesterol less than 70 mg/dL As above, check exercise Myoview and push risk reduction to include lipid lowering with LDL cholesterol goal less than 70 mg/dL.      Shared Decision Making/Informed Consent The risks [chest pain, shortness of breath, cardiac arrhythmias, dizziness, blood pressure fluctuations, myocardial infarction, stroke/transient ischemic attack, nausea, vomiting, allergic reaction, radiation exposure, metallic taste sensation and life-threatening complications (estimated to be 1 in 10,000)], benefits (risk stratification, diagnosing coronary artery disease, treatment guidance) and alternatives of a nuclear stress test were discussed in detail with Ms. Nanez and she  agrees to proceed.    Medication Adjustments/Labs and Tests Ordered: Current medicines are reviewed at length with the patient today.  Concerns regarding medicines are outlined above.  Orders Placed This Encounter  Procedures   Lipid panel   Hepatic function panel   Cardiac Stress Test: Informed Consent Details: Physician/Practitioner Attestation; Transcribe to consent form and obtain patient signature  MYOCARDIAL PERFUSION IMAGING   EKG 12-Lead   No orders of the defined types were placed in this encounter.   Patient Instructions  Medication Instructions:  Your physician recommends that you continue on your current medications as directed. Please refer to the Current Medication list given to you today.  *If you need a refill on your cardiac medications before your next appointment, please call your pharmacy*   Lab Work: Lipids, Liver today/this week If you have labs (blood work) drawn today and your tests are completely normal, you will receive your results only by: [place] (if you have MyChart) OR A paper copy in the mail If you have any lab test that is abnormal or we need to change your treatment, we will call you to review the results.   Testing/Procedures: Exercise Myoview Stress test Your physician has requested that you have en exercise stress myoview. For further information please visit [email or web address]. Please follow instruction sheet, as given.  Follow-Up: At [facility], you and your health needs are our priority.  As part of our continuing mission to provide you with exceptional heart care, we have created designated Provider Care Teams.  These Care Teams include your primary Cardiologist (physician) and Advanced Practice Providers (APPs -  Physician Assistants and Nurse Practitioners) who all work together to provide you with the care you need, when you need it.  We recommend signing up for the patient portal called "MyChart".  Sign up  information is provided on this After Visit Summary.  MyChart is used to connect with patients for Virtual Visits (Telemedicine).  Patients are able to view lab/test results, encounter notes, upcoming appointments, etc.  Non-urgent messages can be sent to your provider as well.   To learn more about what you can do with MyChart, go to [email or web address].    Your next appointment:   1 year(s)  The format for your next appointment:   In Person  Provider:   Sherren Mocha, MD or APP     Important Information About Sugar         Signed, Sherren Mocha, MD  02/13/2022 9:30 PM    [place]

## 2022-02-13 LAB — HEPATIC FUNCTION PANEL
ALT: 21 IU/L (ref 0–32)
AST: 19 IU/L (ref 0–40)
Albumin: 4.3 g/dL (ref 3.9–4.9)
Alkaline Phosphatase: 81 IU/L (ref 44–121)
Bilirubin Total: 0.3 mg/dL (ref 0.0–1.2)
Bilirubin, Direct: 0.11 mg/dL (ref 0.00–0.40)
Total Protein: 6.8 g/dL (ref 6.0–8.5)

## 2022-02-13 LAB — LIPID PANEL
Chol/HDL Ratio: 3.4 ratio (ref 0.0–4.4)
Cholesterol, Total: 264 mg/dL — ABNORMAL HIGH (ref 100–199)
HDL: 77 mg/dL (ref >39)
LDL Chol Calc (NIH): 154 mg/dL — ABNORMAL HIGH (ref 0–99)
Triglycerides: 185 mg/dL — ABNORMAL HIGH (ref 0–149)
VLDL Cholesterol Cal: 33 mg/dL (ref 5–40)

## 2022-02-15 ENCOUNTER — Other Ambulatory Visit (HOSPITAL_COMMUNITY): Payer: Self-pay

## 2022-02-15 ENCOUNTER — Telehealth: Payer: Self-pay | Admitting: Cardiovascular Disease

## 2022-02-15 DIAGNOSIS — Z79899 Other long term (current) drug therapy: Secondary | ICD-10-CM

## 2022-02-15 DIAGNOSIS — E782 Mixed hyperlipidemia: Secondary | ICD-10-CM

## 2022-02-15 MED ORDER — EZETIMIBE 10 MG PO TABS
10.0000 mg | ORAL_TABLET | Freq: Every day | ORAL | 3 refills | Status: DC
  Filled 2022-02-15: qty 90, 90d supply, fill #0
  Filled 2022-05-22: qty 90, 90d supply, fill #1
  Filled 2022-09-10: qty 90, 90d supply, fill #2
  Filled 2022-12-03: qty 90, 90d supply, fill #3

## 2022-02-15 MED ORDER — ROSUVASTATIN CALCIUM 10 MG PO TABS
10.0000 mg | ORAL_TABLET | Freq: Every day | ORAL | 3 refills | Status: DC
  Filled 2022-02-15: qty 90, 90d supply, fill #0
  Filled 2022-05-20: qty 90, 90d supply, fill #1
  Filled 2022-09-03: qty 90, 90d supply, fill #2

## 2022-02-15 NOTE — Telephone Encounter (Signed)
-----   Message from Sherren Mocha, MD sent at 02/14/2022 10:13 PM EST ----- Lipids above goal. Please confirm that she's compliant with crestor 5 mg daily. If so, would increase to 10 mg daily and add zetia 10 mg daily with repeat labs in 3 months. thx

## 2022-02-15 NOTE — Telephone Encounter (Signed)
Pt returned call to office and lab results with MD recommendation to increase Crestor to '10mg'$  daily and start zetia reviewed and she agrees. States she has been taking her Crestor '5mg'$  daily. Labs entered and scheduled for 05/20/22.

## 2022-02-21 ENCOUNTER — Telehealth (HOSPITAL_COMMUNITY): Payer: Self-pay | Admitting: *Deleted

## 2022-02-21 NOTE — Telephone Encounter (Signed)
No DPR on file Left message to call back.

## 2022-02-28 ENCOUNTER — Ambulatory Visit (HOSPITAL_COMMUNITY): Payer: 59 | Attending: Cardiovascular Disease

## 2022-02-28 DIAGNOSIS — N183 Chronic kidney disease, stage 3 unspecified: Secondary | ICD-10-CM | POA: Diagnosis not present

## 2022-02-28 DIAGNOSIS — R931 Abnormal findings on diagnostic imaging of heart and coronary circulation: Secondary | ICD-10-CM

## 2022-02-28 DIAGNOSIS — I129 Hypertensive chronic kidney disease with stage 1 through stage 4 chronic kidney disease, or unspecified chronic kidney disease: Secondary | ICD-10-CM | POA: Insufficient documentation

## 2022-02-28 DIAGNOSIS — R0609 Other forms of dyspnea: Secondary | ICD-10-CM | POA: Diagnosis not present

## 2022-02-28 DIAGNOSIS — E782 Mixed hyperlipidemia: Secondary | ICD-10-CM | POA: Diagnosis not present

## 2022-02-28 LAB — MYOCARDIAL PERFUSION IMAGING
Angina Index: 0
Duke Treadmill Score: 9
Estimated workload: 10.1
Exercise duration (min): 8 min
Exercise duration (sec): 31 s
LV dias vol: 47 mL (ref 46–106)
LV sys vol: 23 mL
MPHR: 157 {beats}/min
Nuc Stress EF: 50 %
Peak HR: 153 {beats}/min
Percent HR: 97 %
Rest HR: 68 {beats}/min
Rest Nuclear Isotope Dose: 10.4 mCi
SDS: 0
SRS: 0
SSS: 0
ST Depression (mm): 0 mm
Stress Nuclear Isotope Dose: 32 mCi
TID: 0.95

## 2022-02-28 MED ORDER — TECHNETIUM TC 99M TETROFOSMIN IV KIT
10.4000 | PACK | Freq: Once | INTRAVENOUS | Status: AC | PRN
  Administered 2022-02-28: 10.4 via INTRAVENOUS

## 2022-02-28 MED ORDER — REGADENOSON 0.4 MG/5ML IV SOLN: 0.4000 mg | Freq: Once | INTRAVENOUS | Status: AC

## 2022-02-28 MED ORDER — TECHNETIUM TC 99M TETROFOSMIN IV KIT
32.0000 | PACK | Freq: Once | INTRAVENOUS | Status: AC | PRN
  Administered 2022-02-28: 32 via INTRAVENOUS

## 2022-03-01 ENCOUNTER — Other Ambulatory Visit (HOSPITAL_COMMUNITY): Payer: Self-pay

## 2022-03-08 ENCOUNTER — Other Ambulatory Visit: Payer: Self-pay | Admitting: Cardiovascular Disease

## 2022-03-08 ENCOUNTER — Other Ambulatory Visit (HOSPITAL_COMMUNITY): Payer: Self-pay

## 2022-03-08 MED ORDER — METOPROLOL SUCCINATE ER 25 MG PO TB24
25.0000 mg | ORAL_TABLET | Freq: Every day | ORAL | 1 refills | Status: DC
  Filled 2022-03-08: qty 90, 90d supply, fill #0
  Filled 2022-06-17: qty 90, 90d supply, fill #1

## 2022-03-11 DIAGNOSIS — Z13220 Encounter for screening for lipoid disorders: Secondary | ICD-10-CM | POA: Diagnosis not present

## 2022-03-11 DIAGNOSIS — Z0000 Encounter for general adult medical examination without abnormal findings: Secondary | ICD-10-CM | POA: Diagnosis not present

## 2022-03-11 DIAGNOSIS — Z114 Encounter for screening for human immunodeficiency virus [HIV]: Secondary | ICD-10-CM | POA: Diagnosis not present

## 2022-03-18 DIAGNOSIS — Z1211 Encounter for screening for malignant neoplasm of colon: Secondary | ICD-10-CM | POA: Diagnosis not present

## 2022-03-18 DIAGNOSIS — Z23 Encounter for immunization: Secondary | ICD-10-CM | POA: Diagnosis not present

## 2022-03-18 DIAGNOSIS — Z0000 Encounter for general adult medical examination without abnormal findings: Secondary | ICD-10-CM | POA: Diagnosis not present

## 2022-03-19 ENCOUNTER — Other Ambulatory Visit: Payer: Self-pay | Admitting: Family Medicine

## 2022-03-19 DIAGNOSIS — Z1231 Encounter for screening mammogram for malignant neoplasm of breast: Secondary | ICD-10-CM

## 2022-03-20 ENCOUNTER — Other Ambulatory Visit (HOSPITAL_COMMUNITY): Payer: Self-pay

## 2022-03-20 DIAGNOSIS — I10 Essential (primary) hypertension: Secondary | ICD-10-CM | POA: Diagnosis not present

## 2022-03-20 MED ORDER — ZEPBOUND 2.5 MG/0.5ML ~~LOC~~ SOAJ
2.5000 mg | SUBCUTANEOUS | 1 refills | Status: DC
  Filled 2022-03-20: qty 2, 28d supply, fill #0

## 2022-03-21 ENCOUNTER — Other Ambulatory Visit (HOSPITAL_COMMUNITY): Payer: Self-pay

## 2022-03-22 ENCOUNTER — Other Ambulatory Visit (HOSPITAL_COMMUNITY): Payer: Self-pay

## 2022-03-22 MED ORDER — ESCITALOPRAM OXALATE 20 MG PO TABS
20.0000 mg | ORAL_TABLET | Freq: Every day | ORAL | 3 refills | Status: DC
  Filled 2022-03-22: qty 90, 90d supply, fill #0
  Filled 2022-06-30: qty 90, 90d supply, fill #1
  Filled 2022-10-16: qty 90, 90d supply, fill #2
  Filled 2023-02-05: qty 90, 90d supply, fill #3

## 2022-03-25 ENCOUNTER — Other Ambulatory Visit (HOSPITAL_COMMUNITY): Payer: Self-pay

## 2022-03-28 ENCOUNTER — Other Ambulatory Visit (HOSPITAL_COMMUNITY): Payer: Self-pay

## 2022-05-14 ENCOUNTER — Ambulatory Visit
Admission: RE | Admit: 2022-05-14 | Discharge: 2022-05-14 | Disposition: A | Discharge status: Home / Self Care | Payer: 59 | Source: Ambulatory Visit | Attending: Family Medicine | Admitting: Family Medicine

## 2022-05-14 ENCOUNTER — Other Ambulatory Visit (HOSPITAL_COMMUNITY): Payer: Self-pay

## 2022-05-14 DIAGNOSIS — Z85828 Personal history of other malignant neoplasm of skin: Secondary | ICD-10-CM | POA: Diagnosis not present

## 2022-05-14 DIAGNOSIS — Z1231 Encounter for screening mammogram for malignant neoplasm of breast: Secondary | ICD-10-CM | POA: Diagnosis not present

## 2022-05-14 DIAGNOSIS — D225 Melanocytic nevi of trunk: Secondary | ICD-10-CM | POA: Diagnosis not present

## 2022-05-14 DIAGNOSIS — L720 Epidermal cyst: Secondary | ICD-10-CM | POA: Diagnosis not present

## 2022-05-14 DIAGNOSIS — L821 Other seborrheic keratosis: Secondary | ICD-10-CM | POA: Diagnosis not present

## 2022-05-14 DIAGNOSIS — L570 Actinic keratosis: Secondary | ICD-10-CM | POA: Diagnosis not present

## 2022-05-14 DIAGNOSIS — L814 Other melanin hyperpigmentation: Secondary | ICD-10-CM | POA: Diagnosis not present

## 2022-05-14 MED ORDER — TRETINOIN 0.05 % EX CREA
TOPICAL_CREAM | Freq: Every day | CUTANEOUS | 11 refills | Status: AC
  Filled 2022-05-14: qty 20, 30d supply, fill #0

## 2022-05-15 ENCOUNTER — Other Ambulatory Visit (HOSPITAL_COMMUNITY): Payer: Self-pay

## 2022-05-15 DIAGNOSIS — H6121 Impacted cerumen, right ear: Secondary | ICD-10-CM | POA: Diagnosis not present

## 2022-05-15 DIAGNOSIS — I10 Essential (primary) hypertension: Secondary | ICD-10-CM | POA: Diagnosis not present

## 2022-05-15 MED ORDER — ZEPBOUND 2.5 MG/0.5ML ~~LOC~~ SOAJ
2.5000 mg | SUBCUTANEOUS | 6 refills | Status: AC
  Filled 2022-05-15: qty 2, 28d supply, fill #0
  Filled 2022-07-28: qty 2, 28d supply, fill #1
  Filled 2022-09-17: qty 2, 28d supply, fill #2
  Filled 2023-02-20: qty 2, 28d supply, fill #3

## 2022-05-16 ENCOUNTER — Other Ambulatory Visit: Payer: Self-pay | Admitting: Family Medicine

## 2022-05-16 DIAGNOSIS — R928 Other abnormal and inconclusive findings on diagnostic imaging of breast: Secondary | ICD-10-CM

## 2022-05-20 ENCOUNTER — Ambulatory Visit: Payer: 59 | Attending: Cardiovascular Disease

## 2022-05-20 ENCOUNTER — Other Ambulatory Visit (HOSPITAL_COMMUNITY): Payer: Self-pay

## 2022-05-23 ENCOUNTER — Other Ambulatory Visit: Payer: Self-pay

## 2022-05-24 ENCOUNTER — Other Ambulatory Visit (HOSPITAL_COMMUNITY): Payer: Self-pay

## 2022-05-31 ENCOUNTER — Ambulatory Visit
Admission: RE | Admit: 2022-05-31 | Discharge: 2022-05-31 | Disposition: A | Discharge status: Home / Self Care | Payer: 59 | Source: Ambulatory Visit | Attending: Family Medicine | Admitting: Family Medicine

## 2022-05-31 ENCOUNTER — Ambulatory Visit: Payer: 59

## 2022-05-31 DIAGNOSIS — R928 Other abnormal and inconclusive findings on diagnostic imaging of breast: Secondary | ICD-10-CM | POA: Diagnosis not present

## 2022-06-10 ENCOUNTER — Encounter: Payer: Self-pay | Admitting: Family Medicine

## 2022-06-18 ENCOUNTER — Other Ambulatory Visit (HOSPITAL_COMMUNITY): Payer: Self-pay

## 2022-07-09 ENCOUNTER — Other Ambulatory Visit (HOSPITAL_COMMUNITY): Payer: Self-pay

## 2022-07-15 ENCOUNTER — Other Ambulatory Visit (HOSPITAL_COMMUNITY): Payer: Self-pay

## 2022-07-15 DIAGNOSIS — M1000 Idiopathic gout, unspecified site: Secondary | ICD-10-CM | POA: Diagnosis not present

## 2022-07-15 DIAGNOSIS — E663 Overweight: Secondary | ICD-10-CM | POA: Diagnosis not present

## 2022-07-15 DIAGNOSIS — I10 Essential (primary) hypertension: Secondary | ICD-10-CM | POA: Diagnosis not present

## 2022-07-15 MED ORDER — ALLOPURINOL 100 MG PO TABS
100.0000 mg | ORAL_TABLET | Freq: Every day | ORAL | 1 refills | Status: DC
  Filled 2022-07-15: qty 90, 90d supply, fill #0
  Filled 2022-11-08 – 2022-12-03 (×2): qty 90, 90d supply, fill #1

## 2022-07-31 ENCOUNTER — Other Ambulatory Visit (HOSPITAL_COMMUNITY): Payer: Self-pay

## 2022-09-03 ENCOUNTER — Encounter: Payer: Self-pay | Admitting: Gastroenterology

## 2022-09-11 DIAGNOSIS — M109 Gout, unspecified: Secondary | ICD-10-CM | POA: Diagnosis not present

## 2022-09-11 DIAGNOSIS — I10 Essential (primary) hypertension: Secondary | ICD-10-CM | POA: Diagnosis not present

## 2022-09-11 DIAGNOSIS — E782 Mixed hyperlipidemia: Secondary | ICD-10-CM | POA: Diagnosis not present

## 2022-09-11 DIAGNOSIS — E559 Vitamin D deficiency, unspecified: Secondary | ICD-10-CM | POA: Diagnosis not present

## 2022-09-18 ENCOUNTER — Other Ambulatory Visit (HOSPITAL_COMMUNITY): Payer: Self-pay

## 2022-09-18 ENCOUNTER — Other Ambulatory Visit: Payer: Self-pay

## 2022-09-18 DIAGNOSIS — N289 Disorder of kidney and ureter, unspecified: Secondary | ICD-10-CM | POA: Diagnosis not present

## 2022-09-18 DIAGNOSIS — M109 Gout, unspecified: Secondary | ICD-10-CM | POA: Diagnosis not present

## 2022-09-18 DIAGNOSIS — E559 Vitamin D deficiency, unspecified: Secondary | ICD-10-CM | POA: Diagnosis not present

## 2022-09-18 DIAGNOSIS — Z789 Other specified health status: Secondary | ICD-10-CM | POA: Diagnosis not present

## 2022-09-18 DIAGNOSIS — E782 Mixed hyperlipidemia: Secondary | ICD-10-CM | POA: Diagnosis not present

## 2022-09-18 DIAGNOSIS — I10 Essential (primary) hypertension: Secondary | ICD-10-CM | POA: Diagnosis not present

## 2022-09-18 MED ORDER — ROSUVASTATIN CALCIUM 20 MG PO TABS
20.0000 mg | ORAL_TABLET | Freq: Every evening | ORAL | 1 refills | Status: DC
  Filled 2022-09-18: qty 90, 90d supply, fill #0
  Filled 2022-12-03: qty 90, 90d supply, fill #1

## 2022-09-18 MED ORDER — EZETIMIBE 10 MG PO TABS
10.0000 mg | ORAL_TABLET | Freq: Every evening | ORAL | 1 refills | Status: DC
  Filled 2022-09-18: qty 90, 90d supply, fill #0

## 2022-09-20 ENCOUNTER — Other Ambulatory Visit (HOSPITAL_COMMUNITY): Payer: Self-pay

## 2022-10-16 ENCOUNTER — Other Ambulatory Visit: Payer: Self-pay | Admitting: Cardiovascular Disease

## 2022-10-17 ENCOUNTER — Other Ambulatory Visit: Payer: Self-pay

## 2022-10-17 ENCOUNTER — Other Ambulatory Visit (HOSPITAL_COMMUNITY): Payer: Self-pay

## 2022-10-17 MED ORDER — METOPROLOL SUCCINATE ER 25 MG PO TB24
25.0000 mg | ORAL_TABLET | Freq: Every day | ORAL | 3 refills | Status: DC
  Filled 2022-10-17: qty 90, 90d supply, fill #0
  Filled 2023-02-05: qty 90, 90d supply, fill #1

## 2022-11-05 ENCOUNTER — Other Ambulatory Visit (HOSPITAL_COMMUNITY): Payer: Self-pay

## 2022-11-05 ENCOUNTER — Ambulatory Visit (AMBULATORY_SURGERY_CENTER): Payer: 59 | Admitting: *Deleted

## 2022-11-05 ENCOUNTER — Encounter: Payer: Self-pay | Admitting: Gastroenterology

## 2022-11-05 VITALS — Ht 62.0 in | Wt 130.0 lb

## 2022-11-05 DIAGNOSIS — Z1211 Encounter for screening for malignant neoplasm of colon: Secondary | ICD-10-CM

## 2022-11-05 MED ORDER — NA SULFATE-K SULFATE-MG SULF 17.5-3.13-1.6 GM/177ML PO SOLN
1.0000 | Freq: Once | ORAL | 0 refills | Status: AC
  Filled 2022-11-05: qty 354, 1d supply, fill #0
  Filled 2022-11-27: qty 354, 2d supply, fill #0

## 2022-11-05 NOTE — Progress Notes (Signed)
Pt's name and DOB verified at the beginning of the pre-visit.  Pt denies any difficulty with ambulating,sitting, laying down or rolling side to side Gave both [facility] main # and MD on call # prior to instructions.  No egg or soy allergy known to patient  No issues known to pt with past sedation with any surgeries or procedures Pt denies having issues being intubated Patient denies ever being intubated Pt has no issues moving head neck or swallowing No FH of Malignant Hyperthermia Pt is not on diet pills Pt is not on home 02  Pt is not on blood thinners  Pt denies issues with constipation  Pt is not on dialysis Pt denise any abnormal heart rhythms  Pt denies any upcoming cardiac testing Pt encouraged to use to use Singlecare or Goodrx to reduce cost  Patient's chart reviewed by Cathlyn Parsons CNRA prior to pre-visit and patient appropriate for the [facility].  Pre-visit completed and red dot placed by patient's name on their procedure day (on provider's schedule).  . Visit by phone Pt states weight is 130 lb Instructed pt why it is important to and  to call if they have any changes in health or new medications. Directed them to the # given and on instructions.   Pt states they will.  Instructions reviewed with pt and pt states understanding. Instructed to review again prior to procedure. Pt states they will.  Instructions sent by mail with coupon and by my chart

## 2022-11-14 ENCOUNTER — Other Ambulatory Visit (HOSPITAL_COMMUNITY): Payer: Self-pay

## 2022-11-19 ENCOUNTER — Encounter: Payer: 59 | Admitting: Gastroenterology

## 2022-11-22 ENCOUNTER — Other Ambulatory Visit (HOSPITAL_COMMUNITY): Payer: Self-pay

## 2022-11-27 ENCOUNTER — Other Ambulatory Visit (HOSPITAL_COMMUNITY): Payer: Self-pay

## 2022-11-28 ENCOUNTER — Other Ambulatory Visit (HOSPITAL_COMMUNITY): Payer: Self-pay

## 2022-12-03 ENCOUNTER — Other Ambulatory Visit (HOSPITAL_COMMUNITY): Payer: Self-pay

## 2022-12-04 ENCOUNTER — Other Ambulatory Visit (HOSPITAL_COMMUNITY): Payer: Self-pay

## 2022-12-06 ENCOUNTER — Other Ambulatory Visit (HOSPITAL_BASED_OUTPATIENT_CLINIC_OR_DEPARTMENT_OTHER): Payer: Self-pay

## 2022-12-06 MED ORDER — DOXYCYCLINE HYCLATE 100 MG PO TABS
100.0000 mg | ORAL_TABLET | Freq: Two times a day (BID) | ORAL | 0 refills | Status: AC
  Filled 2022-12-06: qty 14, 7d supply, fill #0

## 2022-12-06 MED ORDER — AMLODIPINE BESYLATE 5 MG PO TABS
5.0000 mg | ORAL_TABLET | Freq: Every day | ORAL | 1 refills | Status: DC
  Filled 2022-12-06: qty 90, 90d supply, fill #0
  Filled 2023-03-21 – 2023-03-22 (×2): qty 90, 90d supply, fill #1
  Filled 2023-03-31: qty 90, 90d supply, fill #0

## 2022-12-06 MED ORDER — IPRATROPIUM BROMIDE 0.06 % NA SOLN
2.0000 | Freq: Three times a day (TID) | NASAL | 0 refills | Status: DC
  Filled 2022-12-06: qty 15, 25d supply, fill #0

## 2022-12-07 ENCOUNTER — Other Ambulatory Visit (HOSPITAL_BASED_OUTPATIENT_CLINIC_OR_DEPARTMENT_OTHER): Payer: Self-pay

## 2022-12-17 IMAGING — CT CT CARDIAC CORONARY ARTERY CALCIUM SCORE
3 series · 14 of 20 positions shown, 16 images · non-contrast
Comparison: None.

CLINICAL DATA: 60-year-old Caucasian female with history of
hypertension, hyperlipidemia and family history of heart disease.

EXAM:
CT CARDIAC CORONARY ARTERY CALCIUM SCORE
TECHNIQUE: Non-contrast imaging through the heart was performed using
prospective ECG gating. Image post processing was performed on an
independent workstation, allowing for quantitative analysis of the
heart and coronary arteries. Note that this exam targets the heart
and the chest was not imaged in its entirety.

[Series 2: calcium scoring 2.00 br60 bestdiast 72% lungs · axial · 0.57mm/px · z∈[+1561,+1657]mm · 4 of 80 slices shown]
[im 16/80  vessel]
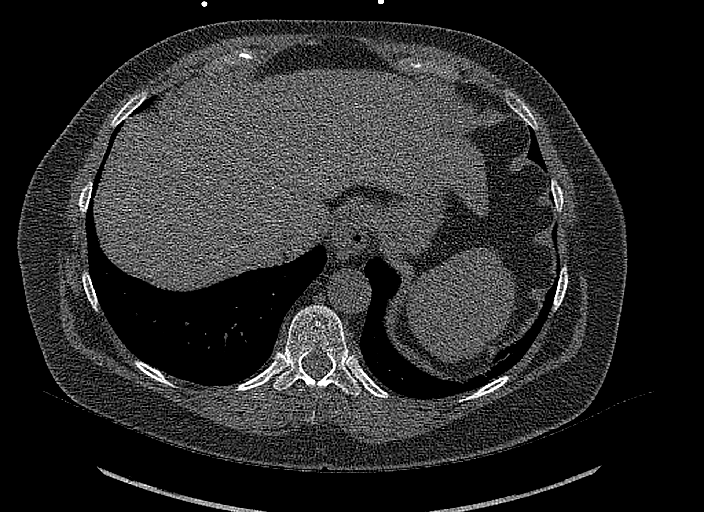
[im 32/80  vessel]
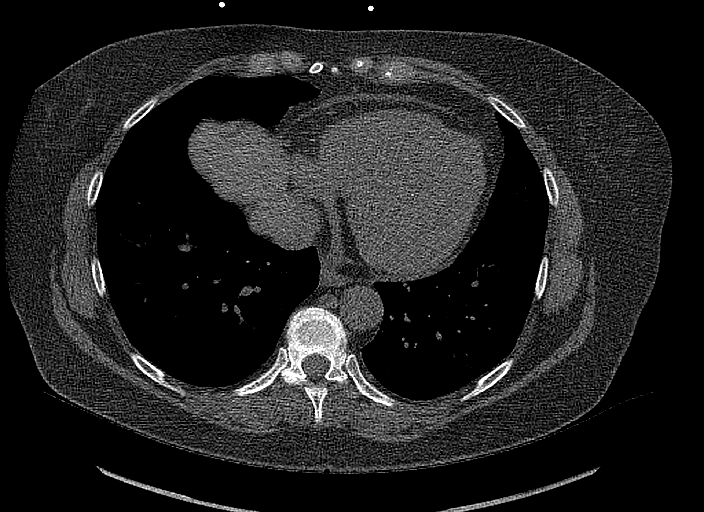
[im 48/80  vessel]
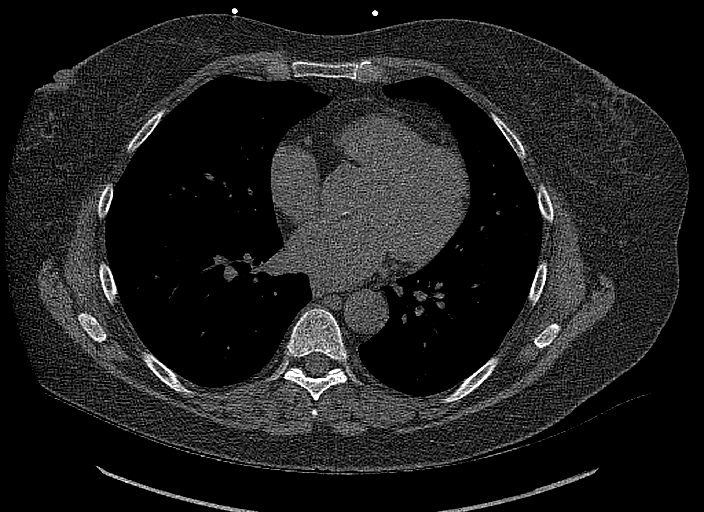
[im 64/80  vessel]
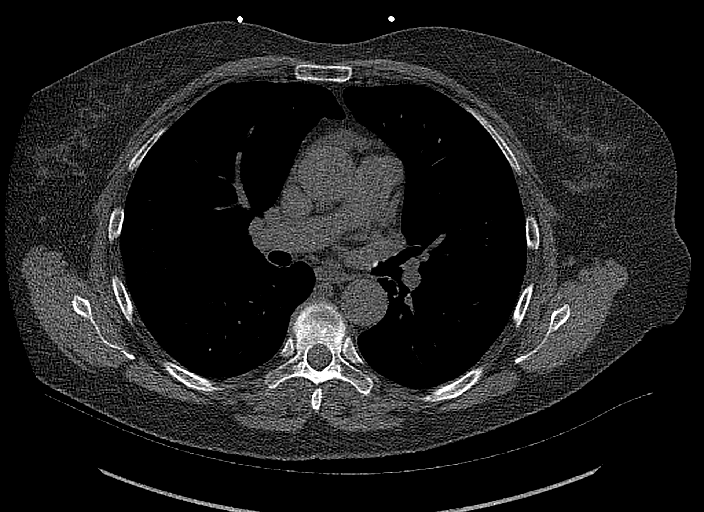

[Series 4: calcium scoring 2.00 qr36 bestdiast 72% hrt calciu · axial · 0.40mm/px · z∈[+1557,+1661]mm · 5 of 80 slices shown, 7 images]
[im 14/80  vessel]
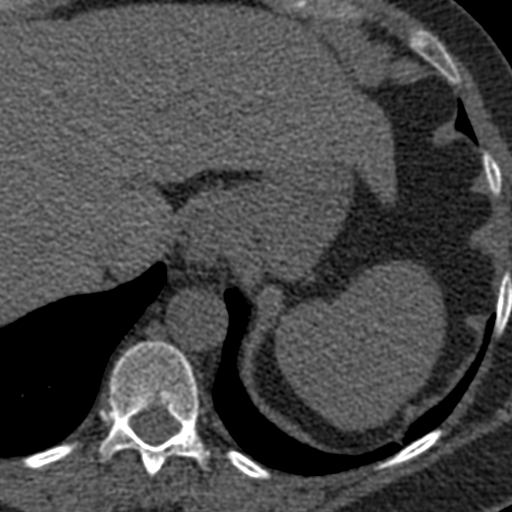
[im 14/80  lung]
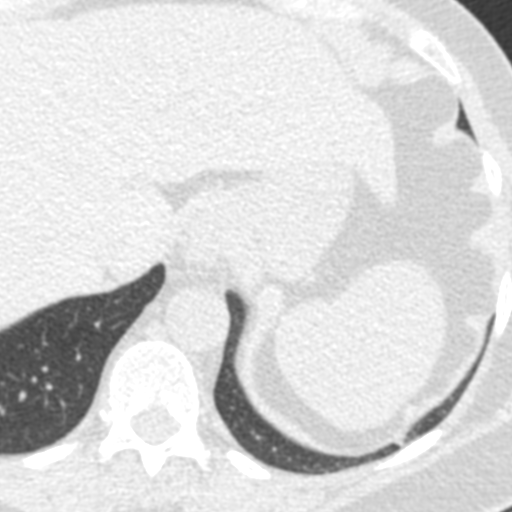
[im 27/80  vessel]
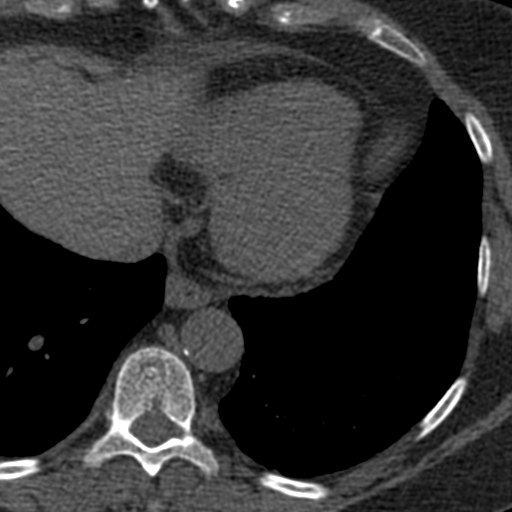
[im 40/80  vessel]
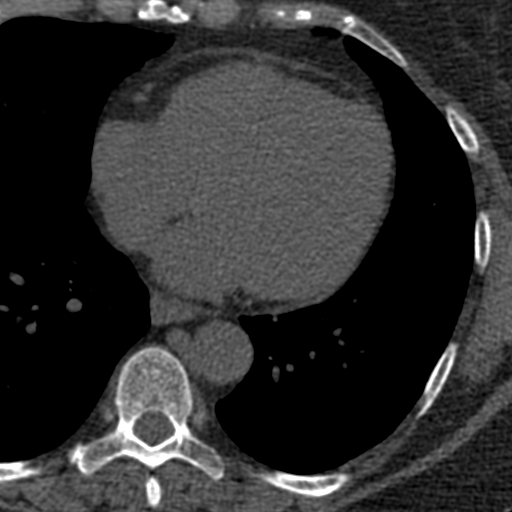
[im 53/80  vessel]
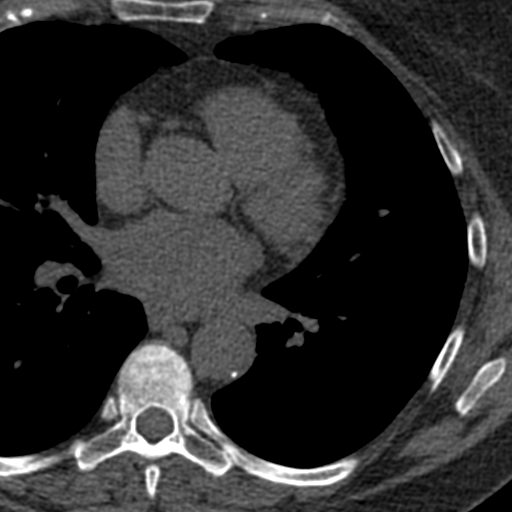
[im 66/80  vessel]
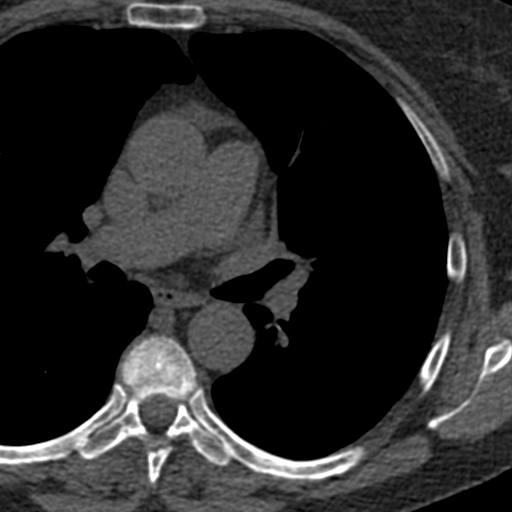
[im 66/80  lung]
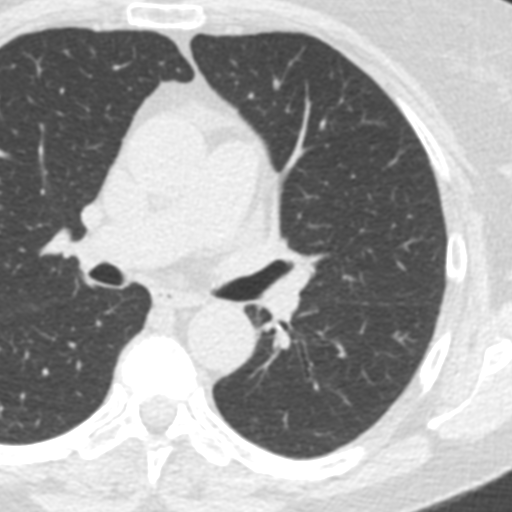

[Series 5: calcium scoring 2.00 br40 bestdiast 72% axial · axial · 0.57mm/px · z∈[+1557,+1661]mm · 5 of 80 slices shown]
[im 14/80  vessel]
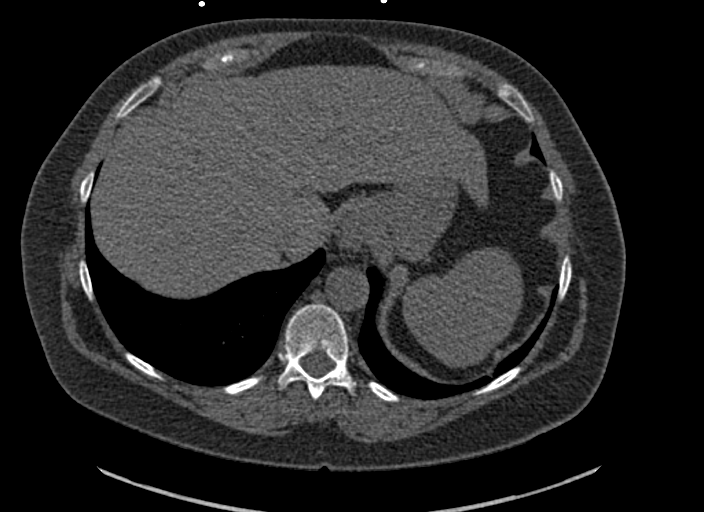
[im 27/80  vessel]
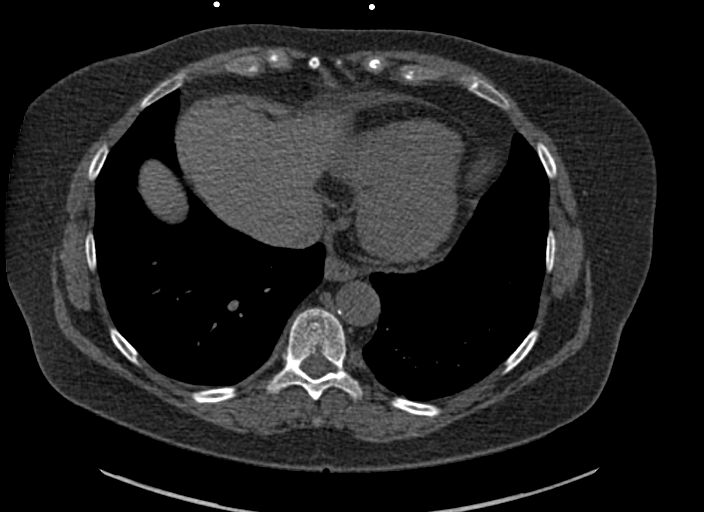
[im 40/80  vessel]
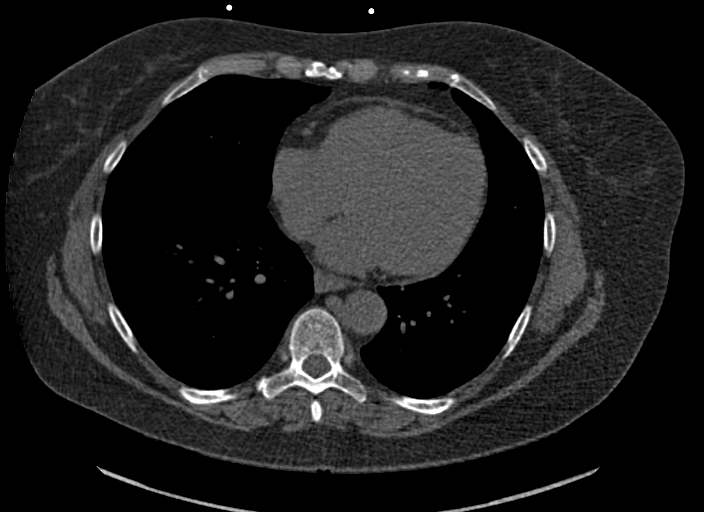
[im 53/80  vessel]
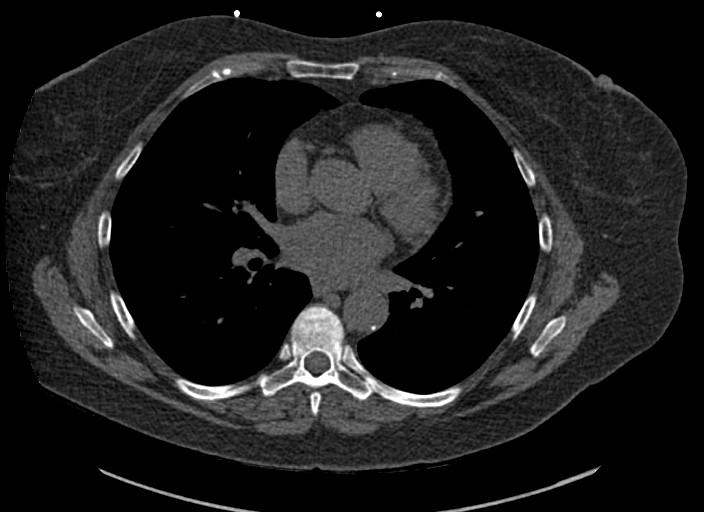
[im 66/80  vessel]
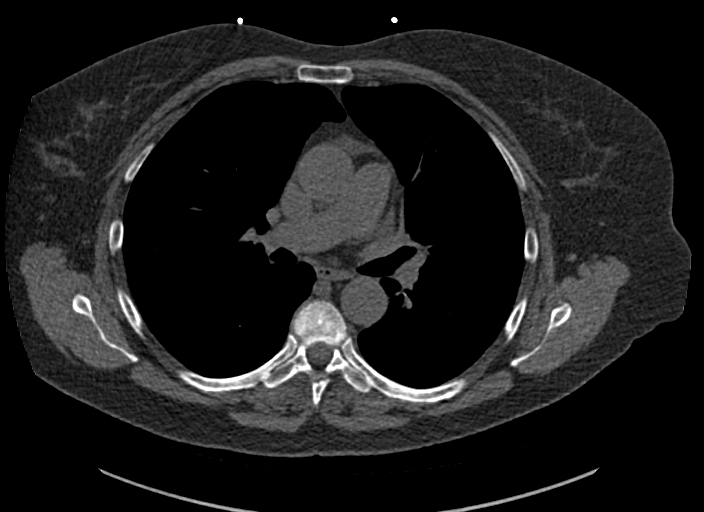

[14 of 20 positions shown; findings below may reference images not displayed]

FINDINGS: CORONARY CALCIUM SCORES:

Left Main: 0

LAD:

LCx: 0

RCA: 0

Total Agatston Score:

[HOSPITAL] percentile: 68

AORTA MEASUREMENTS:

Ascending Aorta: 32 mm

Descending Aorta: 25 mm

OTHER FINDINGS:

The heart size is within normal limits. No pericardial fluid is
identified. Mild calcified plaque in the thoracic aorta. Visualized
segments of the thoracic aorta and central pulmonary arteries are
normal in caliber. Visualized mediastinum and hilar regions
demonstrate no lymphadenopathy or masses. Small hiatal hernia
present. Visualized lungs show no evidence of pulmonary edema,
consolidation, pneumothorax, nodule or pleural fluid. Visualized
upper abdomen and bony structures are unremarkable.
IMPRESSION: 1. Coronary calcium score of 11.7 is at the 68th percentile for the
patient's age, sex and race.
2. Mild atherosclerosis of the thoracic aorta.
3. Small hiatal hernia.

## 2023-01-11 ENCOUNTER — Encounter: Payer: Self-pay | Admitting: Certified Registered Nurse Anesthetist

## 2023-01-12 ENCOUNTER — Other Ambulatory Visit: Payer: Self-pay | Admitting: Cardiovascular Disease

## 2023-01-12 DIAGNOSIS — Z79899 Other long term (current) drug therapy: Secondary | ICD-10-CM

## 2023-01-12 DIAGNOSIS — E782 Mixed hyperlipidemia: Secondary | ICD-10-CM

## 2023-01-13 ENCOUNTER — Other Ambulatory Visit (HOSPITAL_COMMUNITY): Payer: Self-pay

## 2023-01-13 MED ORDER — EZETIMIBE 10 MG PO TABS
10.0000 mg | ORAL_TABLET | Freq: Every day | ORAL | 0 refills | Status: DC
  Filled 2023-01-13 – 2023-04-14 (×2): qty 90, 90d supply, fill #0

## 2023-01-14 ENCOUNTER — Encounter: Payer: Self-pay | Admitting: Gastroenterology

## 2023-01-14 ENCOUNTER — Ambulatory Visit (AMBULATORY_SURGERY_CENTER): Payer: 59 | Admitting: Gastroenterology

## 2023-01-14 VITALS — BP 135/91 | HR 74 | Temp 97.3°F | Resp 12 | Ht 62.0 in | Wt 130.0 lb

## 2023-01-14 DIAGNOSIS — Z1211 Encounter for screening for malignant neoplasm of colon: Secondary | ICD-10-CM

## 2023-01-14 MED ORDER — SODIUM CHLORIDE 0.9 % IV SOLN: 500.0000 mL | Freq: Once | INTRAVENOUS | Status: DC

## 2023-01-14 NOTE — Patient Instructions (Signed)
Resume previous diet Continue present medications There were no colon polyps seen today!   You will need another screening colonoscopy in 10 years, you will receive a letter at that time when you are due for the procedure.   Please call us at [phone] if you have a change in bowel habits, change in family history of colo-rectal cancer, rectal bleeding or other GI concern before that time.  Handouts/information given for diverticulosis and hemorrhoids  YOU HAD AN ENDOSCOPIC PROCEDURE TODAY AT THE [facility]:   Refer to the procedure report that was given to you for any specific questions about what was found during the examination.  If the procedure report does not answer your questions, please call your gastroenterologist to clarify.  If you requested that your care partner not be given the details of your procedure findings, then the procedure report has been included in a sealed envelope for you to review at your convenience later.  YOU SHOULD EXPECT: Some feelings of bloating in the abdomen. Passage of more gas than usual.  Walking can help get rid of the air that was put into your GI tract during the procedure and reduce the bloating. If you had a lower endoscopy (such as a colonoscopy or flexible sigmoidoscopy) you may notice spotting of blood in your stool or on the toilet paper. If you underwent a bowel prep for your procedure, you may not have a normal bowel movement for a few days.  Please Note:  You might notice some irritation and congestion in your nose or some drainage.  This is from the oxygen used during your procedure.  There is no need for concern and it should clear up in a day or so.  SYMPTOMS TO REPORT IMMEDIATELY:  Following lower endoscopy (colonoscopy):  Excessive amounts of blood in the stool  Significant tenderness or worsening of abdominal pains  Swelling of the abdomen that is new, acute  Fever of 100F or higher  For urgent or emergent issues, a  gastroenterologist can be reached at any hour by calling [phone]. Do not use MyChart messaging for urgent concerns.   DIET:  We do recommend a small meal at first, but then you may proceed to your regular diet.  Drink plenty of fluids but you should avoid alcoholic beverages for 24 hours.  ACTIVITY:  You should plan to take it easy for the rest of today and you should NOT DRIVE or use heavy machinery until tomorrow (because of the sedation medicines used during the test).    FOLLOW UP: Our staff will call the number listed on your records the next business day following your procedure.  We will call around 7:15- 8:00 am to check on you and address any questions or concerns that you may have regarding the information given to you following your procedure. If we do not reach you, we will leave a message.     SIGNATURES/CONFIDENTIALITY: You and/or your care partner have signed paperwork which will be entered into your electronic medical record.  These signatures attest to the fact that that the information above on your After Visit Summary has been reviewed and is understood.  Full responsibility of the confidentiality of this discharge information lies with you and/or your care-partner.

## 2023-01-14 NOTE — Progress Notes (Unsigned)
[facility] History and Physical   Primary Care Physician:  Lewis Moccasin, MD   Reason for Procedure:  Colorectal cancer screening  Plan:    Screening colonoscopy with possible interventions as needed     HPI: Penny Rasmussen is a very pleasant 62 y.o. female here for screening colonoscopy. Denies any nausea, vomiting, abdominal pain, melena or bright red blood per rectum  The risks and benefits as well as alternatives of endoscopic procedure(s) have been discussed and reviewed. All questions answered. The patient agrees to proceed.    Past Medical History:  Diagnosis Date   ADHD (attention deficit hyperactivity disorder)    Arthritis    osteoarthritis hands   Glomerulonephritis    HLD (hyperlipidemia)    Hypertension    Infected finger    left index finger   Spinal headache    with c-section    Past Surgical History:  Procedure Laterality Date   ABDOMINAL HYSTERECTOMY     CESAREAN SECTION     x4   I & D EXTREMITY Left 09/24/2019   Procedure: IRRIGATION AND DEBRIDEMENT OF INDEX FINGER;  Surgeon: Betha Loa, MD;  Location: [facility];  Service: Orthopedics;  Laterality: Left;   LIPOMA EXCISION Right 10/24/2016   Procedure: EXCISION SOFT TISSUE MASS RIGHT UPPER BACK;  Surgeon: Darnell Level, MD;  Location: [facility];  Service: General;  Laterality: Right;    Prior to Admission medications   Medication Sig Start Date End Date Taking? Authorizing Provider  allopurinol (ZYLOPRIM) 100 MG tablet Take 1 tablet (100 mg total) by mouth daily for gout prevention 07/15/22  Yes   amLODipine (NORVASC) 5 MG tablet Take 1 tablet (5 mg total) by mouth daily for blood pressure 02/12/21  Yes Tonny Bollman, MD  amLODipine (NORVASC) 5 MG tablet Take 1 tablet (5 mg total) by mouth daily for hypertension 12/06/22  Yes   cholecalciferol (VITAMIN D3) 25 MCG (1000 UNIT) tablet Take 1,000 Units by mouth daily.   Yes [provider]  escitalopram (LEXAPRO) 20 MG tablet Take 1 tablet (20 mg total) by mouth daily. 03/22/22  Yes   ezetimibe (ZETIA) 10 MG tablet Take 1 tablet (10 mg total) by mouth at bedtime for high cholesterol 09/18/22  Yes   ezetimibe (ZETIA) 10 MG tablet Take 1 tablet (10 mg total) by mouth daily. 01/13/23  Yes Tonny Bollman, MD  metoprolol succinate (TOPROL XL) 25 MG 24 hr tablet Take 1 tablet (25 mg total) by mouth daily. 10/17/22  Yes Tonny Bollman, MD  rosuvastatin (CRESTOR) 20 MG tablet Take 1 tablet (20 mg total) by mouth at bedtime for high cholesterol 09/18/22  Yes   ipratropium (ATROVENT) 0.06 % nasal spray Place 2 sprays into both nostrils 3 (three) times daily for 4 days,then stop Patient not taking: Reported on 01/14/2023 12/06/22     tirzepatide (ZEPBOUND) 2.5 MG/0.5ML Pen Inject 2.5 mg into the skin once a week. Patient taking differently: Inject 2.5 mg into the skin once a week. As needed 03/20/22     tirzepatide (ZEPBOUND) 2.5 MG/0.5ML Pen Inject 2.5 mg into the skin once a week. Patient not taking: Reported on 01/14/2023 05/15/22     tretinoin (RETIN-A) 0.05 % cream Apply to the affected area of the skin nightly Patient not taking: Reported on 01/14/2023 05/11/20   Aris Lot, MD  tretinoin (RETIN-A) 0.05 % cream Apply a pea sized amount topically to face at bedtime as tolerated Patient not taking: Reported on 01/14/2023  05/14/22       Current Outpatient Medications  Medication Sig Dispense Refill   allopurinol (ZYLOPRIM) 100 MG tablet Take 1 tablet (100 mg total) by mouth daily for gout prevention 90 tablet 1   amLODipine (NORVASC) 5 MG tablet Take 1 tablet (5 mg total) by mouth daily for blood pressure 90 tablet 3   amLODipine (NORVASC) 5 MG tablet Take 1 tablet (5 mg total) by mouth daily for hypertension 90 tablet 1   cholecalciferol (VITAMIN D3) 25 MCG (1000 UNIT) tablet Take 1,000 Units by mouth daily.     escitalopram (LEXAPRO) 20 MG tablet Take 1 tablet (20 mg  total) by mouth daily. 90 tablet 3   ezetimibe (ZETIA) 10 MG tablet Take 1 tablet (10 mg total) by mouth at bedtime for high cholesterol 90 tablet 1   ezetimibe (ZETIA) 10 MG tablet Take 1 tablet (10 mg total) by mouth daily. 90 tablet 0   metoprolol succinate (TOPROL XL) 25 MG 24 hr tablet Take 1 tablet (25 mg total) by mouth daily. 90 tablet 3   rosuvastatin (CRESTOR) 20 MG tablet Take 1 tablet (20 mg total) by mouth at bedtime for high cholesterol 90 tablet 1   ipratropium (ATROVENT) 0.06 % nasal spray Place 2 sprays into both nostrils 3 (three) times daily for 4 days,then stop (Patient not taking: Reported on 01/14/2023) 15 mL 0   tirzepatide (ZEPBOUND) 2.5 MG/0.5ML Pen Inject 2.5 mg into the skin once a week. (Patient taking differently: Inject 2.5 mg into the skin once a week. As needed) 2 mL 1   tirzepatide (ZEPBOUND) 2.5 MG/0.5ML Pen Inject 2.5 mg into the skin once a week. (Patient not taking: Reported on 01/14/2023) 2 mL 6   tretinoin (RETIN-A) 0.05 % cream Apply to the affected area of the skin nightly (Patient not taking: Reported on 01/14/2023) 20 g 3   tretinoin (RETIN-A) 0.05 % cream Apply a pea sized amount topically to face at bedtime as tolerated (Patient not taking: Reported on 01/14/2023) 20 g 11   Current Facility-Administered Medications  Medication Dose Route Frequency Provider Last Rate Last Admin   0.9 %  sodium chloride infusion  500 mL Intravenous Once Larry Knipp V, MD       Facility-Administered Medications Ordered in Other Visits  Medication Dose Route Frequency Provider Last Rate Last Admin   regadenoson (LEXISCAN) injection SOLN 0.4 mg  0.4 mg Intravenous Once Hilty, Lisette Abu, MD        Allergies as of 01/14/2023   (No Known Allergies)    Family History  Problem Relation Age of Onset   Diabetes Mother    Heart disease Mother    Gout Brother    Kidney cancer Maternal Aunt    Healthy Daughter    Healthy Daughter    Healthy Son    Healthy Son     Breast cancer Niece 21       triple neg   Coronary artery disease Other    Peripheral vascular disease Other        Peripheral arterial disease   Colon cancer Neg Hx    Colon polyps Neg Hx    Esophageal cancer Neg Hx    Rectal cancer Neg Hx    Stomach cancer Neg Hx     Social History   Socioeconomic History   Marital status: Married    Spouse name: Not on file   Number of children: Not on file   Years of education: Not on file  Highest education level: Not on file  Occupational History   Not on file  Tobacco Use   Smoking status: Never   Smokeless tobacco: Never  Vaping Use   Vaping status: Never Used  Substance and Sexual Activity   Alcohol use: Yes    Comment: social   Drug use: No   Sexual activity: Not on file  Other Topics Concern   Not on file  Social History Narrative   Wife of Anne-Marie Battani with vascular surgery. 4 children (3 grown) Active.    Social Determinants of Health   Financial Resource Strain: Not on file  Food Insecurity: Not on file  Transportation Needs: Not on file  Physical Activity: Not on file  Stress: Not on file  Social Connections: Not on file  Intimate Partner Violence: Not on file    Review of Systems:  All other review of systems negative except as mentioned in the HPI.  Physical Exam: Vital signs in last 24 hours: BP (!) 145/92   Pulse 71   Temp (!) 97.3 F (36.3 C)   Resp 15   Ht 5\' 2"  (1.575 m)   Wt 130 lb (59 kg)   SpO2 100%   BMI 23.78 kg/m  General:   Alert, NAD Lungs:  Clear .   Heart:  Regular rate and rhythm Abdomen:  Soft, nontender and nondistended. Neuro/Psych:  Alert and cooperative. Normal mood and affect. A and O x 3  Reviewed labs, radiology imaging, old records and pertinent past GI work up  Patient is appropriate for planned procedure(s) and anesthesia in an ambulatory setting   K. Scherry Ran , MD [phone]

## 2023-01-14 NOTE — Op Note (Signed)
[facility] Patient Name: Penny Rasmussen Procedure Date: 01/14/2023 11:26 AM MRN: [id number] Endoscopist: Napoleon Form , MD, [id number] Age: 62 Referring MD:  Date of Birth: 03-May-1960 Gender: Female Account #: [id number] Procedure:                Colonoscopy Indications:              Screening for colorectal malignant neoplasm Medicines:                Monitored Anesthesia Care Procedure:                Pre-Anesthesia Assessment:                           - Prior to the procedure, a History and Physical                            was performed, and patient medications and                            allergies were reviewed. The patient's tolerance of                            previous anesthesia was also reviewed. The risks                            and benefits of the procedure and the sedation                            options and risks were discussed with the patient.                            All questions were answered, and informed consent                            was obtained. Prior Anticoagulants: The patient has                            taken no anticoagulant or antiplatelet agents. ASA                            Grade Assessment: II - A patient with mild systemic                            disease. After reviewing the risks and benefits,                            the patient was deemed in satisfactory condition to                            undergo the procedure.                           After obtaining informed consent, the colonoscope  was passed under direct vision. Throughout the                            procedure, the patient's blood pressure, pulse, and                            oxygen saturations were monitored continuously. The                            Olympus Scope [id number] was introduced through the                            anus and advanced to the the cecum, identified by                             appendiceal orifice and ileocecal valve. The                            colonoscopy was performed without difficulty. The                            patient tolerated the procedure well. The quality                            of the bowel preparation was good. The ileocecal                            valve, appendiceal orifice, and rectum were                            photographed. Scope In: 11:43:48 AM Scope Out: 12:00:39 PM Scope Withdrawal Time: 0 hours 7 minutes 49 seconds  Total Procedure Duration: 0 hours 16 minutes 51 seconds  Findings:                 The perianal and digital rectal examinations were                            normal.                           Scattered small-mouthed diverticula were found in                            the sigmoid colon, descending colon, transverse                            colon and ascending colon.                           Non-bleeding external and internal hemorrhoids were                            found during retroflexion. The hemorrhoids were  medium-sized. Complications:            No immediate complications. Estimated Blood Loss:     Estimated blood loss was minimal. Impression:               - Diverticulosis in the sigmoid colon, in the                            descending colon, in the transverse colon and in                            the ascending colon.                           - Non-bleeding external and internal hemorrhoids.                           - No specimens collected. Recommendation:           - Patient has a contact number available for                            emergencies. The signs and symptoms of potential                            delayed complications were discussed with the                            patient. Return to normal activities tomorrow.                            Written discharge instructions were provided to the                            patient.                            - Resume previous diet.                           - Continue present medications.                           - Repeat colonoscopy in 10 years for surveillance. Napoleon Form, MD 01/14/2023 12:06:47 PM This report has been signed electronically.

## 2023-01-14 NOTE — Progress Notes (Signed)
Pt's states no medical or surgical changes since previsit or office visit. 

## 2023-01-15 ENCOUNTER — Telehealth: Payer: Self-pay

## 2023-01-15 ENCOUNTER — Encounter: Payer: Self-pay | Admitting: Gastroenterology

## 2023-01-15 NOTE — Telephone Encounter (Signed)
  Follow up Call-     01/14/2023   10:16 AM  Call back number  Post procedure Call Back phone  # [phone]  Permission to leave phone message Yes     Patient questions:  Do you have a fever, pain , or abdominal swelling? No. Pain Score  0 *  Have you tolerated food without any problems? Yes.    Have you been able to return to your normal activities? Yes.    Do you have any questions about your discharge instructions: Diet   No. Medications  No. Follow up visit  No.  Do you have questions or concerns about your Care? No.  Actions: * If pain score is 4 or above: No action needed, pain <4.

## 2023-02-05 ENCOUNTER — Other Ambulatory Visit (HOSPITAL_COMMUNITY): Payer: Self-pay

## 2023-02-10 ENCOUNTER — Other Ambulatory Visit (HOSPITAL_COMMUNITY): Payer: Self-pay

## 2023-02-10 ENCOUNTER — Ambulatory Visit: Payer: 59 | Attending: Cardiovascular Disease | Admitting: Cardiovascular Disease

## 2023-02-10 ENCOUNTER — Encounter: Payer: Self-pay | Admitting: Cardiovascular Disease

## 2023-02-10 VITALS — BP 122/90 | HR 62 | Ht 62.5 in | Wt 136.6 lb

## 2023-02-10 DIAGNOSIS — E782 Mixed hyperlipidemia: Secondary | ICD-10-CM | POA: Diagnosis not present

## 2023-02-10 DIAGNOSIS — I10 Essential (primary) hypertension: Secondary | ICD-10-CM

## 2023-02-10 MED ORDER — METOPROLOL SUCCINATE ER 25 MG PO TB24
25.0000 mg | ORAL_TABLET | Freq: Every day | ORAL | 3 refills | Status: AC
  Filled 2023-02-10 – 2023-08-11 (×3): qty 90, 90d supply, fill #0
  Filled 2023-09-04: qty 90, 90d supply, fill #1

## 2023-02-10 NOTE — Patient Instructions (Signed)
Medication Instructions:  REFILLED Metoprolol Succinate *If you need a refill on your cardiac medications before your next appointment, please call your pharmacy*  Follow-Up: At [facility], you and your health needs are our priority.  As part of our continuing mission to provide you with exceptional heart care, we have created designated Provider Care Teams.  These Care Teams include your primary Cardiologist (physician) and Advanced Practice Providers (APPs -  Physician Assistants and Nurse Practitioners) who all work together to provide you with the care you need, when you need it.  We recommend signing up for the patient portal called "MyChart".  Sign up information is provided on this After Visit Summary.  MyChart is used to connect with patients for Virtual Visits (Telemedicine).  Patients are able to view lab/test results, encounter notes, upcoming appointments, etc.  Non-urgent messages can be sent to your provider as well.   To learn more about what you can do with MyChart, go to [email or web address].    Your next appointment:   1 year(s)  Provider:   Tonny Bollman, MD

## 2023-02-10 NOTE — Progress Notes (Signed)
Cardiology Office Note:    Date:  02/10/2023   ID:  Ollen Gross, DOB [birth year], MRN [id number]  PCP:  Lewis Moccasin, MD   [facility] Providers Cardiologist:  None     Referring MD: Lewis Moccasin, MD   Chief Complaint  Patient presents with   Follow-up    Hypertension    History of Present Illness:    [place] Bruckner is a 62 y.o. female with a hx of Hypertension and stage III chronic kidney disease, presenting for follow-up evaluation.  A nuclear stress test last year showed normal myocardial perfusion.  A coronary calcium score of 12 placed her in the 68th percentile for age and gender.  An echocardiogram shows normal LV and RV function with an LVEF of 65% and no valvular disease.  The patient is here alone today.  She has been doing well from a cardiac perspective.  She has not had any chest pain, chest pressure, or shortness of breath.  She has an occasional heart palpitation but overall has been doing well in that regard.  She has run out of her metoprolol but continues on her amlodipine.  She denies edema, orthopnea, or PND.  She has had no lightheadedness or syncope.  Current Medications: Current Meds  Medication Sig   allopurinol (ZYLOPRIM) 100 MG tablet Take 1 tablet (100 mg total) by mouth daily for gout prevention   amLODipine (NORVASC) 5 MG tablet Take 1 tablet (5 mg total) by mouth daily for hypertension   cholecalciferol (VITAMIN D3) 25 MCG (1000 UNIT) tablet Take 1,000 Units by mouth daily.   escitalopram (LEXAPRO) 20 MG tablet Take 1 tablet (20 mg total) by mouth daily.   ezetimibe (ZETIA) 10 MG tablet Take 1 tablet (10 mg total) by mouth daily.   rosuvastatin (CRESTOR) 20 MG tablet Take 1 tablet (20 mg total) by mouth at bedtime for high cholesterol   tirzepatide (ZEPBOUND) 2.5 MG/0.5ML Pen Inject 2.5 mg into the skin once a week.   tretinoin (RETIN-A) 0.05 % cream Apply a pea sized amount topically to face at bedtime as  tolerated   [DISCONTINUED] amLODipine (NORVASC) 5 MG tablet Take 1 tablet (5 mg total) by mouth daily for blood pressure   [DISCONTINUED] ezetimibe (ZETIA) 10 MG tablet Take 1 tablet (10 mg total) by mouth at bedtime for high cholesterol   [DISCONTINUED] ipratropium (ATROVENT) 0.06 % nasal spray Place 2 sprays into both nostrils 3 (three) times daily for 4 days,then stop   [DISCONTINUED] metoprolol succinate (TOPROL XL) 25 MG 24 hr tablet Take 1 tablet (25 mg total) by mouth daily.   [DISCONTINUED] tirzepatide (ZEPBOUND) 2.5 MG/0.5ML Pen Inject 2.5 mg into the skin once a week. (Patient taking differently: Inject 2.5 mg into the skin once a week. As needed)   [DISCONTINUED] tretinoin (RETIN-A) 0.05 % cream Apply to the affected area of the skin nightly     Allergies:   Patient has no known allergies.   ROS:   Please see the history of present illness.    All other systems reviewed and are negative.  EKGs/Labs/Other Studies Reviewed:    The following studies were reviewed today: Cardiac Studies & Procedures     STRESS TESTS  MYOCARDIAL PERFUSION IMAGING 02/28/2022  Narrative   The study is normal. The study is low risk.   No ST deviation was noted.   LV perfusion is normal.   Left ventricular function is normal. Nuclear stress EF: 50 %. The left ventricular  ejection fraction is mildly decreased (45-54%). End diastolic cavity size is normal. End systolic cavity size is normal.   Prior study not available for comparison.  Normal perfusion. LVEF 50% with normal wall motion (visually, the LVEF appears higher). Consider comparison with echo. Overall, the study is low risk. No prior study for comparison.   ECHOCARDIOGRAM  ECHOCARDIOGRAM COMPLETE 03/01/2021  Narrative ECHOCARDIOGRAM REPORT    Patient Name:   YASHA URENA Date of Exam: 03/01/2021 Medical Rec #:  [id number]                 Height:       62.0 in Accession #:    [id number]                Weight:        160.8 lb Date of Birth:  1960/05/07                 BSA:          1.742 m Patient Age:    60 years                  BP:           120/86 mmHg Patient Gender: F                         HR:           77 bpm. Exam Location:  [address]  Procedure: 2D Echo, Cardiac Doppler and Color Doppler  Indications:    R00.2 Palpitations  History:        Patient has no prior history of Echocardiogram examinations. Risk Factors:Hypertension and Dyslipidemia.  Sonographer:    Sedonia Small Rodgers-Jones RDCS Referring Phys: [id number] Dejan Angert  IMPRESSIONS   1. Left ventricular ejection fraction, by estimation, is 60 to 65%. The left ventricle has normal function. The left ventricle has no regional wall motion abnormalities. Left ventricular diastolic parameters are consistent with Grade I diastolic dysfunction (impaired relaxation). 2. Right ventricular systolic function is normal. The right ventricular size is normal. Tricuspid regurgitation signal is inadequate for assessing PA pressure. 3. The mitral valve is grossly normal. No evidence of mitral valve regurgitation. No evidence of mitral stenosis. 4. The aortic valve is tricuspid. There is mild calcification of the aortic valve. Aortic valve regurgitation is not visualized. No aortic stenosis is present. 5. The inferior vena cava is normal in size with greater than 50% respiratory variability, suggesting right atrial pressure of 3 mmHg.  Conclusion(s)/Recommendation(s): Normal biventricular function without evidence of hemodynamically significant valvular heart disease.  FINDINGS Left Ventricle: Left ventricular ejection fraction, by estimation, is 60 to 65%. The left ventricle has normal function. The left ventricle has no regional wall motion abnormalities. The left ventricular internal cavity size was normal in size. There is no left ventricular hypertrophy. Left ventricular diastolic parameters are consistent with Grade I diastolic dysfunction  (impaired relaxation).  Right Ventricle: The right ventricular size is normal. No increase in right ventricular wall thickness. Right ventricular systolic function is normal. Tricuspid regurgitation signal is inadequate for assessing PA pressure.  Left Atrium: Left atrial size was normal in size.  Right Atrium: Right atrial size was normal in size.  Pericardium: Trivial pericardial effusion is present. Presence of epicardial fat layer.  Mitral Valve: The mitral valve is grossly normal. No evidence of mitral valve regurgitation. No evidence of mitral valve stenosis.  Tricuspid Valve: The tricuspid valve is grossly  normal. Tricuspid valve regurgitation is not demonstrated. No evidence of tricuspid stenosis.  Aortic Valve: The aortic valve is tricuspid. There is mild calcification of the aortic valve. Aortic valve regurgitation is not visualized. No aortic stenosis is present.  Pulmonic Valve: The pulmonic valve was grossly normal. Pulmonic valve regurgitation is not visualized. No evidence of pulmonic stenosis.  Aorta: The aortic root and ascending aorta are structurally normal, with no evidence of dilitation.  Venous: The right lower pulmonary vein is normal. The inferior vena cava is normal in size with greater than 50% respiratory variability, suggesting right atrial pressure of 3 mmHg.  IAS/Shunts: The atrial septum is grossly normal.   LEFT VENTRICLE PLAX 2D LVIDd:         4.00 cm   Diastology LVIDs:         2.20 cm   LV e' medial:    5.64 cm/s LV PW:         1.10 cm   LV E/e' medial:  14.0 LV IVS:        1.10 cm   LV e' lateral:   6.76 cm/s LVOT diam:     1.90 cm   LV E/e' lateral: 11.7 LV SV:         62 LV SV Index:   36 LVOT Area:     2.84 cm   RIGHT VENTRICLE             IVC RV Basal diam:  3.10 cm     IVC diam: 1.50 cm RV S prime:     19.75 cm/s TAPSE (M-mode): 2.4 cm  LEFT ATRIUM             Index        RIGHT ATRIUM          Index LA diam:        4.00 cm 2.30  cm/m   RA Area:     9.14 cm LA Vol (A2C):   31.8 ml 18.25 ml/m  RA Volume:   19.00 ml 10.90 ml/m LA Vol (A4C):   33.4 ml 19.17 ml/m LA Biplane Vol: 33.0 ml 18.94 ml/m AORTIC VALVE LVOT Vmax:   124.00 cm/s LVOT Vmean:  79.850 cm/s LVOT VTI:    0.220 m  AORTA Ao Root diam: 3.30 cm Ao Asc diam:  3.10 cm  MITRAL VALVE MV Area (PHT): 3.75 cm     SHUNTS MV Decel Time: 203 msec     Systemic VTI:  0.22 m MV E velocity: 79.15 cm/s   Systemic Diam: 1.90 cm MV A velocity: 110.50 cm/s MV E/A ratio:  0.72  Lennie Odor MD Electronically signed by Lennie Odor MD Signature Date/Time: 03/01/2021/6:29:16 PM    Final    MONITORS  LONG TERM MONITOR (3-14 DAYS) 02/21/2021  Narrative Patch Wear Time:  3 days and 17 hours (2022-12-12T14:49:00-0500 to 2022-12-16T08:22:48-0500)  Patient had a min HR of 61 bpm, max HR of 190 bpm, and avg HR of 95 bpm. Predominant underlying rhythm was Sinus Rhythm. 678 Supraventricular Tachycardia runs occurred, the run with the fastest interval lasting 4 beats with a max rate of 190 bpm, the longest lasting 50.2 secs with an avg rate of 149 bpm. Isolated SVEs were occasional (2.5%, 12298), SVE Couplets were occasional (1.0%, 2575), and SVE Triplets were rare (<1.0%, 1073). Isolated VEs were rare (<1.0%), and no VE Couplets or VE Triplets were present.  SUMMARY 1. The basic rhythm is normal sinus with an average HR of 95  bpm 2. No atrial fibrillation or flutter 3. No high-grade heart block or pathologic pauses 4. There are rare PVC's and occasional supraventricular beats (2.5%) with few supraventricular runs, the longest lasting 50 seconds           EKG:   EKG Interpretation Date/Time:  Monday February 10 2023 15:23:00 EST Ventricular Rate:  63 PR Interval:  168 QRS Duration:  80 QT Interval:  420 QTC Calculation: 429 R Axis:   89  Text Interpretation: Normal sinus rhythm Normal ECG When compared with ECG of 21-Oct-2016 13:28, Questionable  change in QRS axis Confirmed by Tonny Bollman [phone]) on 02/10/2023 3:33:42 PM    Recent Labs: 02/12/2022: ALT 21  Recent Lipid Panel    Component Value Date/Time   CHOL 264 (H) 02/12/2022 1530   TRIG 185 (H) 02/12/2022 1530   HDL 77 02/12/2022 1530   CHOLHDL 3.4 02/12/2022 1530   LDLCALC 154 (H) 02/12/2022 1530            Physical Exam:    VS:  BP (!) 122/90   Pulse 62   Ht 5' 2.5" (1.588 m)   Wt 136 lb 9.6 oz (62 kg)   SpO2 99%   BMI 24.59 kg/m     Wt Readings from Last 3 Encounters:  02/10/23 136 lb 9.6 oz (62 kg)  01/14/23 130 lb (59 kg)  11/05/22 130 lb (59 kg)     GEN:  Well nourished, well developed in no acute distress HEENT: Normal NECK: No JVD; No carotid bruits LYMPHATICS: No lymphadenopathy CARDIAC: RRR, no murmurs, rubs, gallops RESPIRATORY:  Clear to auscultation without rales, wheezing or rhonchi  ABDOMEN: Soft, non-tender, non-distended MUSCULOSKELETAL:  No edema; No deformity  SKIN: Warm and dry NEUROLOGIC:  Alert and oriented x 3 PSYCHIATRIC:  Normal affect   Assessment & Plan Mixed hyperlipidemia Treated with rosuvastatin and ezetimibe.  Will continue the same. Essential hypertension Blood pressure is borderline.  Continue amlodipine 5 mg daily.  She had leg edema at the higher 10 mg dose.  Resume metoprolol succinate 25 mg daily.  Continue healthy lifestyle and increase exercise regimen.     Medication Adjustments/Labs and Tests Ordered: Current medicines are reviewed at length with the patient today.  Concerns regarding medicines are outlined above.  Orders Placed This Encounter  Procedures   EKG 12-Lead   EKG 12-Lead   EKG 12-Lead   Meds ordered this encounter  Medications   metoprolol succinate (TOPROL XL) 25 MG 24 hr tablet    Sig: Take 1 tablet (25 mg total) by mouth daily.    Dispense:  90 tablet    Refill:  3    Patient Instructions  Medication Instructions:  REFILLED Metoprolol Succinate *If you need a refill on  your cardiac medications before your next appointment, please call your pharmacy*  Follow-Up: At [facility], you and your health needs are our priority.  As part of our continuing mission to provide you with exceptional heart care, we have created designated Provider Care Teams.  These Care Teams include your primary Cardiologist (physician) and Advanced Practice Providers (APPs -  Physician Assistants and Nurse Practitioners) who all work together to provide you with the care you need, when you need it.  We recommend signing up for the patient portal called "MyChart".  Sign up information is provided on this After Visit Summary.  MyChart is used to connect with patients for Virtual Visits (Telemedicine).  Patients are able to view lab/test results, encounter  notes, upcoming appointments, etc.  Non-urgent messages can be sent to your provider as well.   To learn more about what you can do with MyChart, go to [email or web address].    Your next appointment:   1 year(s)  Provider:   Tonny Bollman, MD     Signed, Tonny Bollman, MD  02/10/2023 5:31 PM    [facility]

## 2023-02-10 NOTE — Assessment & Plan Note (Signed)
Blood pressure is borderline.  Continue amlodipine 5 mg daily.  She had leg edema at the higher 10 mg dose.  Resume metoprolol succinate 25 mg daily.  Continue healthy lifestyle and increase exercise regimen.

## 2023-02-20 ENCOUNTER — Other Ambulatory Visit (HOSPITAL_COMMUNITY): Payer: Self-pay

## 2023-03-17 DIAGNOSIS — Z0000 Encounter for general adult medical examination without abnormal findings: Secondary | ICD-10-CM | POA: Diagnosis not present

## 2023-03-17 DIAGNOSIS — Z114 Encounter for screening for human immunodeficiency virus [HIV]: Secondary | ICD-10-CM | POA: Diagnosis not present

## 2023-03-17 DIAGNOSIS — Z13220 Encounter for screening for lipoid disorders: Secondary | ICD-10-CM | POA: Diagnosis not present

## 2023-03-20 DIAGNOSIS — R9409 Abnormal results of other function studies of central nervous system: Secondary | ICD-10-CM | POA: Diagnosis not present

## 2023-03-20 DIAGNOSIS — E782 Mixed hyperlipidemia: Secondary | ICD-10-CM | POA: Diagnosis not present

## 2023-03-20 DIAGNOSIS — N289 Disorder of kidney and ureter, unspecified: Secondary | ICD-10-CM | POA: Diagnosis not present

## 2023-03-21 ENCOUNTER — Other Ambulatory Visit (HOSPITAL_COMMUNITY): Payer: Self-pay

## 2023-03-21 ENCOUNTER — Other Ambulatory Visit: Payer: Self-pay | Admitting: Family Medicine

## 2023-03-21 DIAGNOSIS — R7989 Other specified abnormal findings of blood chemistry: Secondary | ICD-10-CM

## 2023-03-22 ENCOUNTER — Other Ambulatory Visit (HOSPITAL_BASED_OUTPATIENT_CLINIC_OR_DEPARTMENT_OTHER): Payer: Self-pay

## 2023-03-24 ENCOUNTER — Other Ambulatory Visit (HOSPITAL_COMMUNITY): Payer: Self-pay

## 2023-03-24 MED ORDER — ALLOPURINOL 100 MG PO TABS
100.0000 mg | ORAL_TABLET | Freq: Every day | ORAL | 1 refills | Status: DC
  Filled 2023-03-24 – 2023-07-05 (×2): qty 90, 90d supply, fill #0

## 2023-03-25 DIAGNOSIS — Z01411 Encounter for gynecological examination (general) (routine) with abnormal findings: Secondary | ICD-10-CM | POA: Diagnosis not present

## 2023-03-25 DIAGNOSIS — Z01419 Encounter for gynecological examination (general) (routine) without abnormal findings: Secondary | ICD-10-CM | POA: Diagnosis not present

## 2023-03-25 DIAGNOSIS — R7989 Other specified abnormal findings of blood chemistry: Secondary | ICD-10-CM | POA: Diagnosis not present

## 2023-03-25 DIAGNOSIS — Z0000 Encounter for general adult medical examination without abnormal findings: Secondary | ICD-10-CM | POA: Diagnosis not present

## 2023-03-26 DIAGNOSIS — R7989 Other specified abnormal findings of blood chemistry: Secondary | ICD-10-CM | POA: Diagnosis not present

## 2023-03-26 DIAGNOSIS — I10 Essential (primary) hypertension: Secondary | ICD-10-CM | POA: Diagnosis not present

## 2023-03-26 DIAGNOSIS — N289 Disorder of kidney and ureter, unspecified: Secondary | ICD-10-CM | POA: Diagnosis not present

## 2023-03-27 ENCOUNTER — Other Ambulatory Visit: Payer: 59

## 2023-03-31 ENCOUNTER — Other Ambulatory Visit (HOSPITAL_BASED_OUTPATIENT_CLINIC_OR_DEPARTMENT_OTHER): Payer: Self-pay

## 2023-03-31 ENCOUNTER — Other Ambulatory Visit (HOSPITAL_COMMUNITY): Payer: Self-pay

## 2023-04-14 ENCOUNTER — Other Ambulatory Visit: Payer: Self-pay | Admitting: Family Medicine

## 2023-04-14 ENCOUNTER — Other Ambulatory Visit (HOSPITAL_COMMUNITY): Payer: Self-pay

## 2023-04-14 DIAGNOSIS — Z0000 Encounter for general adult medical examination without abnormal findings: Secondary | ICD-10-CM

## 2023-04-15 ENCOUNTER — Other Ambulatory Visit (HOSPITAL_BASED_OUTPATIENT_CLINIC_OR_DEPARTMENT_OTHER): Payer: Self-pay

## 2023-04-15 ENCOUNTER — Other Ambulatory Visit (HOSPITAL_COMMUNITY): Payer: Self-pay

## 2023-04-16 ENCOUNTER — Other Ambulatory Visit (HOSPITAL_COMMUNITY): Payer: Self-pay

## 2023-04-16 MED ORDER — ROSUVASTATIN CALCIUM 20 MG PO TABS
20.0000 mg | ORAL_TABLET | Freq: Every evening | ORAL | 1 refills | Status: DC
  Filled 2023-04-16 – 2023-07-23 (×2): qty 90, 90d supply, fill #0

## 2023-05-09 ENCOUNTER — Other Ambulatory Visit (HOSPITAL_COMMUNITY): Payer: Self-pay

## 2023-05-09 MED ORDER — ESCITALOPRAM OXALATE 20 MG PO TABS
20.0000 mg | ORAL_TABLET | Freq: Every day | ORAL | 3 refills | Status: AC
  Filled 2023-05-09 – 2023-08-11 (×2): qty 90, 90d supply, fill #0
  Filled 2023-09-04 – 2023-11-11 (×2): qty 90, 90d supply, fill #1
  Filled 2024-02-12: qty 90, 90d supply, fill #2

## 2023-05-13 ENCOUNTER — Other Ambulatory Visit (HOSPITAL_COMMUNITY): Payer: Self-pay

## 2023-05-14 DIAGNOSIS — L905 Scar conditions and fibrosis of skin: Secondary | ICD-10-CM | POA: Diagnosis not present

## 2023-05-14 DIAGNOSIS — Z85828 Personal history of other malignant neoplasm of skin: Secondary | ICD-10-CM | POA: Diagnosis not present

## 2023-05-14 DIAGNOSIS — D0471 Carcinoma in situ of skin of right lower limb, including hip: Secondary | ICD-10-CM | POA: Diagnosis not present

## 2023-05-14 DIAGNOSIS — D485 Neoplasm of uncertain behavior of skin: Secondary | ICD-10-CM | POA: Diagnosis not present

## 2023-05-14 DIAGNOSIS — L720 Epidermal cyst: Secondary | ICD-10-CM | POA: Diagnosis not present

## 2023-05-14 DIAGNOSIS — L570 Actinic keratosis: Secondary | ICD-10-CM | POA: Diagnosis not present

## 2023-05-14 DIAGNOSIS — L821 Other seborrheic keratosis: Secondary | ICD-10-CM | POA: Diagnosis not present

## 2023-05-14 DIAGNOSIS — L814 Other melanin hyperpigmentation: Secondary | ICD-10-CM | POA: Diagnosis not present

## 2023-05-14 DIAGNOSIS — D1801 Hemangioma of skin and subcutaneous tissue: Secondary | ICD-10-CM | POA: Diagnosis not present

## 2023-05-15 ENCOUNTER — Ambulatory Visit
Admission: RE | Admit: 2023-05-15 | Discharge: 2023-05-15 | Disposition: A | Discharge status: Home / Self Care | Payer: 59 | Source: Ambulatory Visit | Attending: Family Medicine | Admitting: Family Medicine

## 2023-05-15 DIAGNOSIS — Z0000 Encounter for general adult medical examination without abnormal findings: Secondary | ICD-10-CM

## 2023-05-15 DIAGNOSIS — Z1231 Encounter for screening mammogram for malignant neoplasm of breast: Secondary | ICD-10-CM | POA: Diagnosis not present

## 2023-05-19 ENCOUNTER — Other Ambulatory Visit (HOSPITAL_COMMUNITY): Payer: Self-pay

## 2023-05-19 MED ORDER — FLUOROURACIL 5 % EX CREA
1.0000 | TOPICAL_CREAM | Freq: Two times a day (BID) | CUTANEOUS | 0 refills | Status: AC
  Filled 2023-05-19: qty 40, 28d supply, fill #0

## 2023-05-20 ENCOUNTER — Other Ambulatory Visit (HOSPITAL_COMMUNITY): Payer: Self-pay

## 2023-05-20 DIAGNOSIS — N02B9 Other recurrent and persistent immunoglobulin A nephropathy: Secondary | ICD-10-CM | POA: Diagnosis not present

## 2023-05-20 DIAGNOSIS — I10 Essential (primary) hypertension: Secondary | ICD-10-CM | POA: Diagnosis not present

## 2023-05-20 DIAGNOSIS — M1000 Idiopathic gout, unspecified site: Secondary | ICD-10-CM | POA: Diagnosis not present

## 2023-05-20 DIAGNOSIS — R809 Proteinuria, unspecified: Secondary | ICD-10-CM | POA: Diagnosis not present

## 2023-05-20 DIAGNOSIS — N1832 Chronic kidney disease, stage 3b: Secondary | ICD-10-CM | POA: Diagnosis not present

## 2023-05-20 MED ORDER — JARDIANCE 10 MG PO TABS
10.0000 mg | ORAL_TABLET | Freq: Every morning | ORAL | 11 refills | Status: AC
  Filled 2023-05-20 – 2023-07-18 (×2): qty 30, 30d supply, fill #0
  Filled 2023-08-11: qty 30, 30d supply, fill #1
  Filled 2023-09-04: qty 30, 30d supply, fill #2
  Filled 2023-10-01: qty 30, 30d supply, fill #3
  Filled 2023-10-31: qty 30, 30d supply, fill #4
  Filled 2023-12-01: qty 30, 30d supply, fill #5
  Filled 2024-01-04: qty 30, 30d supply, fill #6
  Filled 2024-02-07: qty 30, 30d supply, fill #7
  Filled 2024-03-07: qty 30, 30d supply, fill #8
  Filled 2024-04-05: qty 30, 30d supply, fill #9

## 2023-05-27 ENCOUNTER — Other Ambulatory Visit (HOSPITAL_COMMUNITY): Payer: Self-pay

## 2023-06-01 ENCOUNTER — Other Ambulatory Visit (HOSPITAL_COMMUNITY): Payer: Self-pay

## 2023-06-03 ENCOUNTER — Other Ambulatory Visit (HOSPITAL_COMMUNITY): Payer: Self-pay

## 2023-06-06 ENCOUNTER — Other Ambulatory Visit (HOSPITAL_COMMUNITY): Payer: Self-pay

## 2023-07-05 ENCOUNTER — Other Ambulatory Visit (HOSPITAL_BASED_OUTPATIENT_CLINIC_OR_DEPARTMENT_OTHER): Payer: Self-pay

## 2023-07-07 ENCOUNTER — Other Ambulatory Visit (HOSPITAL_BASED_OUTPATIENT_CLINIC_OR_DEPARTMENT_OTHER): Payer: Self-pay

## 2023-07-07 MED ORDER — AMLODIPINE BESYLATE 5 MG PO TABS
5.0000 mg | ORAL_TABLET | Freq: Every day | ORAL | 0 refills | Status: DC
  Filled 2023-07-07: qty 90, 90d supply, fill #0

## 2023-07-19 ENCOUNTER — Other Ambulatory Visit (HOSPITAL_BASED_OUTPATIENT_CLINIC_OR_DEPARTMENT_OTHER): Payer: Self-pay

## 2023-07-23 ENCOUNTER — Other Ambulatory Visit (HOSPITAL_COMMUNITY): Payer: Self-pay

## 2023-07-23 ENCOUNTER — Other Ambulatory Visit: Payer: Self-pay

## 2023-07-23 ENCOUNTER — Other Ambulatory Visit: Payer: Self-pay | Admitting: Cardiovascular Disease

## 2023-07-23 DIAGNOSIS — E782 Mixed hyperlipidemia: Secondary | ICD-10-CM

## 2023-07-23 DIAGNOSIS — Z79899 Other long term (current) drug therapy: Secondary | ICD-10-CM

## 2023-07-23 MED ORDER — EZETIMIBE 10 MG PO TABS
10.0000 mg | ORAL_TABLET | Freq: Every day | ORAL | 2 refills | Status: AC
  Filled 2023-07-23 – 2023-07-31 (×2): qty 90, 90d supply, fill #0
  Filled 2023-09-04 – 2023-11-01 (×2): qty 90, 90d supply, fill #1
  Filled 2024-02-02: qty 90, 90d supply, fill #2

## 2023-07-31 ENCOUNTER — Other Ambulatory Visit (HOSPITAL_BASED_OUTPATIENT_CLINIC_OR_DEPARTMENT_OTHER): Payer: Self-pay

## 2023-07-31 ENCOUNTER — Other Ambulatory Visit (HOSPITAL_COMMUNITY): Payer: Self-pay

## 2023-08-11 ENCOUNTER — Other Ambulatory Visit (HOSPITAL_BASED_OUTPATIENT_CLINIC_OR_DEPARTMENT_OTHER): Payer: Self-pay

## 2023-08-20 DIAGNOSIS — N02B9 Other recurrent and persistent immunoglobulin A nephropathy: Secondary | ICD-10-CM | POA: Diagnosis not present

## 2023-08-20 DIAGNOSIS — R0683 Snoring: Secondary | ICD-10-CM | POA: Diagnosis not present

## 2023-08-20 DIAGNOSIS — I10 Essential (primary) hypertension: Secondary | ICD-10-CM | POA: Diagnosis not present

## 2023-08-20 DIAGNOSIS — D0471 Carcinoma in situ of skin of right lower limb, including hip: Secondary | ICD-10-CM | POA: Diagnosis not present

## 2023-08-20 DIAGNOSIS — R400 Somnolence: Secondary | ICD-10-CM | POA: Diagnosis not present

## 2023-08-20 DIAGNOSIS — E669 Obesity, unspecified: Secondary | ICD-10-CM | POA: Diagnosis not present

## 2023-08-20 DIAGNOSIS — E782 Mixed hyperlipidemia: Secondary | ICD-10-CM | POA: Diagnosis not present

## 2023-08-20 DIAGNOSIS — L570 Actinic keratosis: Secondary | ICD-10-CM | POA: Diagnosis not present

## 2023-08-20 DIAGNOSIS — N1832 Chronic kidney disease, stage 3b: Secondary | ICD-10-CM | POA: Diagnosis not present

## 2023-08-20 DIAGNOSIS — Z85828 Personal history of other malignant neoplasm of skin: Secondary | ICD-10-CM | POA: Diagnosis not present

## 2023-08-25 ENCOUNTER — Other Ambulatory Visit (HOSPITAL_BASED_OUTPATIENT_CLINIC_OR_DEPARTMENT_OTHER): Payer: Self-pay

## 2023-08-25 MED ORDER — ZEPBOUND 2.5 MG/0.5ML ~~LOC~~ SOAJ
2.5000 mg | SUBCUTANEOUS | 0 refills | Status: AC
  Filled 2023-08-25 – 2023-08-26 (×2): qty 2, 28d supply, fill #0

## 2023-08-26 ENCOUNTER — Other Ambulatory Visit (HOSPITAL_BASED_OUTPATIENT_CLINIC_OR_DEPARTMENT_OTHER): Payer: Self-pay

## 2023-08-30 ENCOUNTER — Other Ambulatory Visit (HOSPITAL_BASED_OUTPATIENT_CLINIC_OR_DEPARTMENT_OTHER): Payer: Self-pay

## 2023-09-04 ENCOUNTER — Other Ambulatory Visit (HOSPITAL_BASED_OUTPATIENT_CLINIC_OR_DEPARTMENT_OTHER): Payer: Self-pay

## 2023-09-04 MED ORDER — ALLOPURINOL 100 MG PO TABS
100.0000 mg | ORAL_TABLET | Freq: Every day | ORAL | 1 refills | Status: AC
  Filled 2023-09-04 – 2023-10-07 (×2): qty 90, 90d supply, fill #0

## 2023-09-04 MED ORDER — AMLODIPINE BESYLATE 5 MG PO TABS
5.0000 mg | ORAL_TABLET | Freq: Every day | ORAL | 3 refills | Status: AC
  Filled 2023-09-04 – 2023-10-23 (×2): qty 90, 90d supply, fill #0
  Filled 2023-11-01 – 2024-02-02 (×3): qty 90, 90d supply, fill #1

## 2023-09-04 MED ORDER — ROSUVASTATIN CALCIUM 20 MG PO TABS
20.0000 mg | ORAL_TABLET | Freq: Every evening | ORAL | 1 refills | Status: AC
  Filled 2023-09-04 – 2023-11-01 (×2): qty 90, 90d supply, fill #0
  Filled 2024-02-02: qty 90, 90d supply, fill #1

## 2023-09-06 ENCOUNTER — Other Ambulatory Visit (HOSPITAL_BASED_OUTPATIENT_CLINIC_OR_DEPARTMENT_OTHER): Payer: Self-pay

## 2023-10-07 ENCOUNTER — Other Ambulatory Visit: Payer: Self-pay

## 2023-10-07 ENCOUNTER — Other Ambulatory Visit (HOSPITAL_BASED_OUTPATIENT_CLINIC_OR_DEPARTMENT_OTHER): Payer: Self-pay

## 2023-10-21 DIAGNOSIS — Z85828 Personal history of other malignant neoplasm of skin: Secondary | ICD-10-CM | POA: Diagnosis not present

## 2023-10-21 DIAGNOSIS — L570 Actinic keratosis: Secondary | ICD-10-CM | POA: Diagnosis not present

## 2023-10-23 ENCOUNTER — Other Ambulatory Visit (HOSPITAL_BASED_OUTPATIENT_CLINIC_OR_DEPARTMENT_OTHER): Payer: Self-pay

## 2023-10-31 ENCOUNTER — Other Ambulatory Visit (HOSPITAL_BASED_OUTPATIENT_CLINIC_OR_DEPARTMENT_OTHER): Payer: Self-pay

## 2023-11-02 ENCOUNTER — Other Ambulatory Visit: Payer: Self-pay

## 2023-11-05 ENCOUNTER — Other Ambulatory Visit (HOSPITAL_BASED_OUTPATIENT_CLINIC_OR_DEPARTMENT_OTHER): Payer: Self-pay

## 2024-02-03 ENCOUNTER — Other Ambulatory Visit (HOSPITAL_BASED_OUTPATIENT_CLINIC_OR_DEPARTMENT_OTHER): Payer: Self-pay

## 2024-04-08 ENCOUNTER — Other Ambulatory Visit: Payer: Self-pay | Admitting: Family Medicine

## 2024-04-08 DIAGNOSIS — Z1231 Encounter for screening mammogram for malignant neoplasm of breast: Secondary | ICD-10-CM

## 2024-05-18 ENCOUNTER — Ambulatory Visit
